# Patient Record
Sex: Female | Born: 1985 | Race: White | Hispanic: No | Smoking: Former smoker
Health system: Southern US, Community
[De-identification: ages and names within clinical notes are randomized; demographics above are authoritative.]

## PROBLEM LIST (undated history)

## (undated) ENCOUNTER — Inpatient Hospital Stay (HOSPITAL_COMMUNITY): Payer: Self-pay

## (undated) DIAGNOSIS — F419 Anxiety disorder, unspecified: Secondary | ICD-10-CM

## (undated) DIAGNOSIS — R87629 Unspecified abnormal cytological findings in specimens from vagina: Secondary | ICD-10-CM

## (undated) DIAGNOSIS — F32A Depression, unspecified: Secondary | ICD-10-CM

## (undated) DIAGNOSIS — F329 Major depressive disorder, single episode, unspecified: Secondary | ICD-10-CM

## (undated) HISTORY — PX: WISDOM TOOTH EXTRACTION: SHX21

## (undated) HISTORY — DX: Unspecified abnormal cytological findings in specimens from vagina: R87.629

## (undated) HISTORY — DX: Anxiety disorder, unspecified: F41.9

## (undated) HISTORY — DX: Depression, unspecified: F32.A

## (undated) HISTORY — PX: COLPOSCOPY: SHX161

## (undated) HISTORY — PX: APPENDECTOMY: SHX54

---
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
## 2015-11-05 ENCOUNTER — Ambulatory Visit (INDEPENDENT_AMBULATORY_CARE_PROVIDER_SITE_OTHER): Payer: Medicaid Other | Admitting: Certified Nurse Midwife

## 2015-11-05 ENCOUNTER — Encounter: Payer: Self-pay | Admitting: Certified Nurse Midwife

## 2015-11-05 VITALS — BP 124/69 | HR 87 | Temp 98.9°F | Wt 237.6 lb

## 2015-11-05 DIAGNOSIS — Z3482 Encounter for supervision of other normal pregnancy, second trimester: Secondary | ICD-10-CM

## 2015-11-05 DIAGNOSIS — Z3480 Encounter for supervision of other normal pregnancy, unspecified trimester: Secondary | ICD-10-CM | POA: Insufficient documentation

## 2015-11-05 NOTE — Progress Notes (Signed)
Subjective:    Michele Wells is being seen today for her first obstetrical visit.  This is not a planned pregnancy. She is at 3727w6d gestation. Her obstetrical history is significant for anxiety, was on mediciations Lexapro and xanax 0.25 mg for panic attacks. Relationship with FOB: significant other, living together. Patient does intend to breast feed. Pregnancy history fully reviewed.  Not currently employed.    The information documented in the HPI was reviewed and verified.  Menstrual History: OB History    Gravida Para Term Preterm AB Living   2 0 0 0 1 0   SAB TAB Ectopic Multiple Live Births   1 0 0 0 0      Menarche age: around 6312-30 years of age.   Patient's last menstrual period was 07/03/2015.    Past Medical History:  Diagnosis Date  . Anxiety     Past Surgical History:  Procedure Laterality Date  . APPENDECTOMY       (Not in a hospital admission) Not on File  Social History  Substance Use Topics  . Smoking status: Former Games developermoker  . Smokeless tobacco: Never Used  . Alcohol use No    Family History  Problem Relation Age of Onset  . Cancer Maternal Grandmother   . Diabetes Maternal Grandmother   . Cancer Maternal Grandfather   . Heart disease Paternal Grandfather      Review of Systems Constitutional: negative for weight loss Gastrointestinal: negative for vomiting Genitourinary:negative for genital lesions and vaginal discharge and dysuria Musculoskeletal:negative for back pain Behavioral/Psych: negative for abusive relationship, depression, illegal drug usage and tobacco use    Objective:    BP 124/69   Pulse 87   Temp 98.9 F (37.2 C)   Wt 237 lb 9.6 oz (107.8 kg)   LMP 07/03/2015  General Appearance:    Alert, cooperative, no distress, appears stated age  Head:    Normocephalic, without obvious abnormality, atraumatic  Eyes:    PERRL, conjunctiva/corneas clear, EOM's intact, fundi    benign, both eyes  Ears:    Normal TM's and external ear  canals, both ears  Nose:   Nares normal, septum midline, mucosa normal, no drainage    or sinus tenderness  Throat:   Lips, mucosa, and tongue normal; teeth and gums normal  Neck:   Supple, symmetrical, trachea midline, no adenopathy;    thyroid:  no enlargement/tenderness/nodules; no carotid   bruit or JVD  Back:     Symmetric, no curvature, ROM normal, no CVA tenderness  Lungs:     Clear to auscultation bilaterally, respirations unlabored  Chest Wall:    No tenderness or deformity   Heart:    Regular rate and rhythm, S1 and S2 normal, no murmur, rub   or gallop  Breast Exam:    No tenderness, masses, or nipple abnormality  Abdomen:     Soft, non-tender, bowel sounds active all four quadrants,    no masses, no organomegaly  Genitalia:    Normal female without lesion, discharge or tenderness  Extremities:   Extremities normal, atraumatic, no cyanosis or edema  Pulses:   2+ and symmetric all extremities  Skin:   Skin color, texture, turgor normal, no rashes or lesions  Lymph nodes:   Cervical, supraclavicular, and axillary nodes normal  Neurologic:   CNII-XII intact, normal strength, sensation and reflexes    throughout     FHR:   145        FH: @U .  Lab Review Urine pregnancy test Labs reviewed yes Radiologic studies reviewed no Assessment:    Pregnancy at [redacted]w[redacted]d weeks   [facility] transfer @17  wks  Plan:     Prenatal vitamins.  Counseling provided regarding continued use of seat belts, cessation of alcohol consumption, smoking or use of illicit drugs; infection precautions i.e., influenza/TDAP immunizations, toxoplasmosis,CMV, parvovirus, listeria and varicella; workplace safety, exercise during pregnancy; routine dental care, safe medications, sexual activity, hot tubs, saunas, pools, travel, caffeine use, fish and methlymercury, potential toxins, hair treatments, varicose veins Weight gain recommendations per IOM guidelines reviewed: underweight/BMI< 18.5--> gain 28 -  40 lbs; normal weight/BMI 18.5 - 24.9--> gain 25 - 35 lbs; overweight/BMI 25 - 29.9--> gain 15 - 25 lbs; obese/BMI >30->gain  11 - 20 lbs Problem list reviewed and updated. FIRST/CF mutation testing/NIPT/QUAD SCREEN/fragile X/Ashkenazi Jewish population testing/Spinal muscular atrophy discussed: ordered. Role of ultrasound in pregnancy discussed; fetal survey: ordered. Amniocentesis discussed: not indicated. VBAC calculator score: VBAC consent form provided Meds ordered this encounter  Medications  . pyridOXINE (VITAMIN B-6) 100 MG tablet    Sig: Take 100 mg by mouth daily.  Marland Kitchen doxylamine, Sleep, (UNISOM) 25 MG tablet    Sig: Take 25 mg by mouth at bedtime as needed.  . Prenatal Vit-Fe Phos-FA-Omega (VITAFOL GUMMIES PO)    Sig: Take by mouth.   Orders Placed This Encounter  Procedures  . Culture, OB Urine  . [place] MFM OB COMP + 14 WK    Standing Status:   Future    Standing Expiration Date:   01/04/2017    Order Specific Question:   Reason for Exam (SYMPTOM  OR DIAGNOSIS REQUIRED)    Answer:   fetal anatomy scan    Order Specific Question:   Preferred imaging location?    Answer:   [facility]Ul[facility]ound  . TSH  . HIV antibody  . Hemoglobinopathy evaluation  . Varicella zoster antibody, IgG  . Prenatal Profile I  . MaterniT21 PLUS Core+SCA    Order Specific Question:   Is the patient insulin dependent?    Answer:   No    Order Specific Question:   Please enter gestational age. This should be expressed as weeks AND days, i.e. 16w 6d. Enter weeks here. Enter days in next question.    Answer:   45    Order Specific Question:   Please enter gestational age. This should be expressed as weeks AND days, i.e. 16w 6d. Enter days here. Enter weeks in previous question.    Answer:   6    Order Specific Question:   How was gestational age calculated?    Answer:   Ultrasound    Order Specific Question:   Please give the date of LMP OR Ultrasound OR Estimated date of delivery.    Answer:   04/08/2016     Order Specific Question:   Number of Fetuses (Type of Pregnancy):    Answer:   1    Order Specific Question:   Indications for performing the test? (please choose all that apply):    Answer:   Routine screening    Order Specific Question:   Other Indications? (Y=Yes, N=No)    Answer:   N    Order Specific Question:   If this is a repeat specimen, please indicate the reason:    Answer:   Not indicated    Order Specific Question:   Please specify the patient's race: (C=White/Caucasion, B=Black, I=Native American, A=Asian, H=Hispanic, O=Other, U=Unknown)  Answer:   C    Order Specific Question:   Donor Egg - indicate if the egg was obtained from in vitro fertilization.    Answer:   N    Order Specific Question:   Age of Egg Donor.    Answer:   35    Order Specific Question:   Prior Down Syndrome/ONTD screening during current pregnancy.    Answer:   N    Order Specific Question:   Prior First Trimester Testing    Answer:   N    Order Specific Question:   Prior Second Trimester Testing    Answer:   N    Order Specific Question:   Family History of Neural Tube Defects    Answer:   N    Order Specific Question:   Prior Pregnancy with Down Syndrome    Answer:   N    Order Specific Question:   Please give the patient's weight (in pounds)    Answer:   237.6  . Hemoglobin A1c    Follow up in 4 weeks. 50% of 30 min visit spent on counseling and coordination of care.

## 2015-11-06 ENCOUNTER — Other Ambulatory Visit: Payer: Self-pay | Admitting: Certified Nurse Midwife

## 2015-11-07 LAB — CULTURE, OB URINE

## 2015-11-07 LAB — URINE CULTURE, OB REFLEX

## 2015-11-12 ENCOUNTER — Other Ambulatory Visit: Payer: Self-pay | Admitting: Certified Nurse Midwife

## 2015-11-12 LAB — PRENATAL PROFILE I(LABCORP)
Antibody Screen: NEGATIVE
BASOS ABS: 0 10*3/uL (ref 0.0–0.2)
Basos: 0 %
EOS (ABSOLUTE): 0.1 10*3/uL (ref 0.0–0.4)
EOS: 1 %
HEMOGLOBIN: 12 g/dL (ref 11.1–15.9)
HEP B S AG: NEGATIVE
Hematocrit: 35.8 % (ref 34.0–46.6)
IMMATURE GRANULOCYTES: 0 %
Immature Grans (Abs): 0 10*3/uL (ref 0.0–0.1)
LYMPHS ABS: 1.9 10*3/uL (ref 0.7–3.1)
Lymphs: 23 %
MCH: 29.1 pg (ref 26.6–33.0)
MCHC: 33.5 g/dL (ref 31.5–35.7)
MCV: 87 fL (ref 79–97)
MONOS ABS: 0.6 10*3/uL (ref 0.1–0.9)
Monocytes: 7 %
NEUTROS ABS: 5.8 10*3/uL (ref 1.4–7.0)
NEUTROS PCT: 69 %
PLATELETS: 275 10*3/uL (ref 150–379)
RBC: 4.12 x10E6/uL (ref 3.77–5.28)
RDW: 14.4 % (ref 12.3–15.4)
RH TYPE: POSITIVE
RPR Ser Ql: NONREACTIVE
Rubella Antibodies, IGG: 11.4 index (ref >0.99)
WBC: 8.4 10*3/uL (ref 3.4–10.8)

## 2015-11-12 LAB — HEMOGLOBINOPATHY EVALUATION
HEMOGLOBIN A2 QUANTITATION: 2.5 % (ref 0.7–3.1)
HGB A: 97.5 % (ref 94.0–98.0)
HGB C: 0 %
HGB S: 0 %
Hemoglobin F Quantitation: 0 % (ref 0.0–2.0)

## 2015-11-12 LAB — MATERNIT21 PLUS CORE+SCA
CHROMOSOME 18: NEGATIVE
Chromosome 13: NEGATIVE
Chromosome 21: NEGATIVE
PDF: 0
Y Chromosome: DETECTED

## 2015-11-12 LAB — HEMOGLOBIN A1C
ESTIMATED AVERAGE GLUCOSE: 103 mg/dL
Hgb A1c MFr Bld: 5.2 % (ref 4.8–5.6)

## 2015-11-12 LAB — TSH: TSH: 1.55 u[IU]/mL (ref 0.450–4.500)

## 2015-11-12 LAB — HIV ANTIBODY (ROUTINE TESTING W REFLEX): HIV Screen 4th Generation wRfx: NONREACTIVE

## 2015-11-12 LAB — VARICELLA ZOSTER ANTIBODY, IGG: Varicella zoster IgG: <135 index — ABNORMAL LOW (ref >165)

## 2015-11-19 ENCOUNTER — Ambulatory Visit (HOSPITAL_COMMUNITY)
Admission: RE | Admit: 2015-11-19 | Discharge: 2015-11-19 | Disposition: A | Discharge status: Home / Self Care | Payer: Medicaid Other | Source: Ambulatory Visit | Attending: Certified Nurse Midwife | Admitting: Certified Nurse Midwife

## 2015-11-19 ENCOUNTER — Other Ambulatory Visit: Payer: Self-pay | Admitting: Certified Nurse Midwife

## 2015-11-19 DIAGNOSIS — Z36 Encounter for antenatal screening of mother: Secondary | ICD-10-CM | POA: Diagnosis not present

## 2015-11-19 DIAGNOSIS — O99212 Obesity complicating pregnancy, second trimester: Secondary | ICD-10-CM | POA: Diagnosis not present

## 2015-11-19 DIAGNOSIS — E669 Obesity, unspecified: Secondary | ICD-10-CM | POA: Insufficient documentation

## 2015-11-19 DIAGNOSIS — Z3A19 19 weeks gestation of pregnancy: Secondary | ICD-10-CM | POA: Diagnosis not present

## 2015-11-19 DIAGNOSIS — Z1389 Encounter for screening for other disorder: Secondary | ICD-10-CM

## 2015-11-19 DIAGNOSIS — Z3482 Encounter for supervision of other normal pregnancy, second trimester: Secondary | ICD-10-CM

## 2015-11-23 ENCOUNTER — Other Ambulatory Visit: Payer: Self-pay | Admitting: Certified Nurse Midwife

## 2015-11-23 DIAGNOSIS — Z3482 Encounter for supervision of other normal pregnancy, second trimester: Secondary | ICD-10-CM

## 2015-12-03 ENCOUNTER — Ambulatory Visit (INDEPENDENT_AMBULATORY_CARE_PROVIDER_SITE_OTHER): Payer: Medicaid Other | Admitting: Certified Nurse Midwife

## 2015-12-03 DIAGNOSIS — Z3482 Encounter for supervision of other normal pregnancy, second trimester: Secondary | ICD-10-CM

## 2015-12-03 NOTE — Patient Instructions (Addendum)
Pregnancy, The Father's Role A father has an important role during his partner's pregnancy, labor, delivery, and after the birth of the baby. It is important to help and support your partner through this new period. There are many physical and emotional changes that happen. To be helpful and supportive during this time, you should know and understand what is happening to your partner during pregnancy, labor, delivery, and after the baby is born. WHAT ARE THE STAGES OF PREGNANCY? Pregnancy usually lasts about 40 weeks. The pregnancy is divided into three trimesters. First Trimester During the first 13 weeks, your partner may:  Feel tired.  Have painful breasts.  Feel nauseous or throw up.  Urinate more often.  Have mood changes. All of these changes are normal. If they are happening, try to be helpful, supportive, and understanding. This may include helping with household duties and activities and spending more time with each other. Second Trimester During the next 14-28 weeks:  Your partner will likely feel better and more energetic.  This is the best time of the pregnancy to be more active together.  You will be able to see her belly showing the pregnancy.  You may be able to feel the baby kick.  Your partner may have soreness or aching in her back as she gains weight. You can help her by carrying heavy things and by rubbing her back when she is feeling sore. Third Trimester During the final 12 weeks, your partner may:  Become more uncomfortable as the baby grows.  Have a hard time doing everyday activities, and her balance may be off.  Have a hard time bending over.  Tire easily.  Have difficulty sleeping. At this time, the birth of your baby is close. You and your partner may have concerns or questions. This is normal. Talk with each other and with your health care provider. Continue to help your partner with housework, encourage her to rest, and rub her sore back and  legs, if this helps her. WHAT CAN I EXPECT OR DO DURING THE PREGNANCY? You can expect to experience some changes. There are also many things you can do to help prepare you and your partner for your baby. Emotional Changes During your partner's pregnancy, emotional changes for you may include:  Having feelings of happiness, excitement, and pride.  Being concerned about having new responsibilities, such as financial or educational responsibilities.  Feeling overwhelmed or scared.  Being worried that a baby will change your relationship with your partner. These feelings are normal. Talk about them openly with your partner and your health care provider. Prenatal Care Attend prenatal care visits with your partner. This is a good time for you to get to know your health care provider, follow the pregnancy, and ask questions.  Prenatal visits usually occur one time each month for six months, then every two weeks for two months, and then one time each week during the last month. You may have more prenatal visits if your health care provider believes this is needed.  Your health care provider usually does an ultrasound of the baby at one of the prenatal visits. This may happen more often if your health care provider thinks it is needed. Sexual Activity Sexual intercourse is safe unless there is a problem with the pregnancy and your health care provider advises you to not have sexual intercourse. Because physical and emotional changes happen in pregnancy, your partner may not want to have sex during certain times. Trying different positions may  make sexual intercourse more comfortable. However, always respect your partner's decision if she does not want to have sex. It is important for both of you to discuss your feelings and desires. Talk with your health care provider about any questions that you may have about sexual intercourse during pregnancy. Childbirth Classes Attend childbirth classes with your  partner if you are able. Classes prepare you and help you to understand what happens during labor and delivery, and they help you and your partner to bond. There are even some classes that are only for new fathers. Classes also teach you and your partner:  Various relaxation techniques.  How to work with her labor pains.  How to focus during labor and delivery. WHAT SHOULD I KNOW ABOUT LABOR AND DELIVERY? Many fathers want to be present while their partner is going through labor and delivery. You may:  Be asked to time the contractions, massage your partner's back, and breathe with her during the contractions.  Get to see and enjoy the excitement of your baby being born, and you may even be able to cut your baby's umbilical cord. If you feel like you might faint or you are uncomfortable, ask someone to help you.  Need to leave the room if a problem develops during labor or delivery. A cesarean delivery, or C-section, is a procedure that may be used to deliver the baby. It is done through an incision in the abdomen and the uterus. A cesarean delivery may be scheduled or it may be an emergency procedure during labor and delivery. Most hospitals allow the father to be in the room for a cesarean delivery unless it is an emergency. Recovery from a cesarean delivery usually requires more help from the father. WHAT HAPPENS AFTER DELIVERY? After your baby is born, your partner will go through many changes again. These changes could last a few months or longer. Postpartum Depression Your partner may take awhile to regain her strength. She may also have feelings of sadness (postpartum blues or postpartum depression). If your partner is acting unusually sad or depressed, talk with your health care provider right away. This can be a serious medical condition that requires treatment. Breastfeeding Your partner may decide to breastfeed the baby. This helps with bonding between the mother and the baby, and  breast milk is the best nutrition for your baby. You can feel included by burping the baby and bottle-feeding the baby with breast milk that was collected from the mother. This allows your partner to rest and helps you to bond with your baby. Sexual Activity It may take a few months for your partner's body to heal and be ready for sexual intercourse again. This may take longer after a cesarean delivery. If you have any questions about having sexual intercourse or if it is painful for your partner, talk with your health care provider. It is possible for breastfeeding mothers to become pregnant even if they are not having menstrual periods. Use birth control (contraception) unless you and your partner would like to become pregnant again. WHAT SHOULD I REMEMBER? Fatherhood and having a baby is an ongoing learning experience. It is common to be anxious, concerned, or afraid that you may not be taking care of your newborn baby properly. It is important to talk with your partner and your health care provider if you are worried or have any questions.   This information is not intended to replace advice given to you by your health care provider. Make sure you  discuss any questions you have with your health care provider.   Document Released: 08/04/2007 Document Revised: 03/08/2014 Document Reviewed: 11/02/2013 Elsevier Interactive Patient Education 2016 [employer].  Preterm Labor Information Preterm labor is when labor starts before you are [redacted] weeks pregnant. The normal length of pregnancy is 39 to 41 weeks.  CAUSES  The cause of preterm labor is not often known. The most common known cause is infection. RISK FACTORS  Having a history of preterm labor.  Having your water break before it should.  Having a placenta that covers the opening of the cervix.  Having a placenta that breaks away from the uterus.  Having a cervix that is too weak to hold the baby in the uterus.  Having too much fluid in  the amniotic sac.  Taking drugs or smoking while pregnant.  Not gaining enough weight while pregnant.  Being younger than 44 and older than 29 years old.  Having a low income.  Being African American. SYMPTOMS  Period-like cramps, belly (abdominal) pain, or back pain.  Contractions that are regular, as often as six in an hour. They may be mild or painful.  Contractions that start at the top of the belly. They then move to the lower belly and back.  Lower belly pressure that seems to get stronger.  Bleeding from the vagina.  Fluid leaking from the vagina. TREATMENT  Treatment depends on:  Your condition.  The condition of your baby.  How many weeks pregnant you are. Your doctor may have you:  Take medicine to stop contractions.  Stay in bed except to use the restroom (bed rest).  Stay in the hospital. WHAT SHOULD YOU DO IF YOU THINK YOU ARE IN PRETERM LABOR? Call your doctor right away. You need to go to the hospital right away.  HOW CAN YOU PREVENT PRETERM LABOR IN FUTURE PREGNANCIES?  Stop smoking, if you smoke.  Maintain healthy weight gain.  Do not take drugs or be around chemicals that are not needed.  Tell your doctor if you think you have an infection.  Tell your doctor if you had a preterm labor before.   This information is not intended to replace advice given to you by your health care provider. Make sure you discuss any questions you have with your health care provider.   Document Released: 05/14/2008 Document Revised: 07/02/2014 Document Reviewed: 03/20/2012 Elsevier Interactive Patient Education 2016 [employer]. Second Trimester of Pregnancy The second trimester is from week 13 through week 28, months 4 through 6. The second trimester is often a time when you feel your best. Your body has also adjusted to being pregnant, and you begin to feel better physically. Usually, morning sickness has lessened or quit completely, you may have more energy,  and you may have an increase in appetite. The second trimester is also a time when the fetus is growing rapidly. At the end of the sixth month, the fetus is about 9 inches long and weighs about 1 pounds. You will likely begin to feel the baby move (quickening) between 18 and 20 weeks of the pregnancy. BODY CHANGES Your body goes through many changes during pregnancy. The changes vary from woman to woman.   Your weight will continue to increase. You will notice your lower abdomen bulging out.  You may begin to get stretch marks on your hips, abdomen, and breasts.  You may develop headaches that can be relieved by medicines approved by your health care provider.  You may  urinate more often because the fetus is pressing on your bladder.  You may develop or continue to have heartburn as a result of your pregnancy.  You may develop constipation because certain hormones are causing the muscles that push waste through your intestines to slow down.  You may develop hemorrhoids or swollen, bulging veins (varicose veins).  You may have back pain because of the weight gain and pregnancy hormones relaxing your joints between the bones in your pelvis and as a result of a shift in weight and the muscles that support your balance.  Your breasts will continue to grow and be tender.  Your gums may bleed and may be sensitive to brushing and flossing.  Dark spots or blotches (chloasma, mask of pregnancy) may develop on your face. This will likely fade after the baby is born.  A dark line from your belly button to the pubic area (linea nigra) may appear. This will likely fade after the baby is born.  You may have changes in your hair. These can include thickening of your hair, rapid growth, and changes in texture. Some women also have hair loss during or after pregnancy, or hair that feels dry or thin. Your hair will most likely return to normal after your baby is born. WHAT TO EXPECT AT YOUR PRENATAL  VISITS During a routine prenatal visit:  You will be weighed to make sure you and the fetus are growing normally.  Your blood pressure will be taken.  Your abdomen will be measured to track your baby's growth.  The fetal heartbeat will be listened to.  Any test results from the previous visit will be discussed. Your health care provider may ask you:  How you are feeling.  If you are feeling the baby move.  If you have had any abnormal symptoms, such as leaking fluid, bleeding, severe headaches, or abdominal cramping.  If you are using any tobacco products, including cigarettes, chewing tobacco, and electronic cigarettes.  If you have any questions. Other tests that may be performed during your second trimester include:  Blood tests that check for:  Low iron levels (anemia).  Gestational diabetes (between 24 and 28 weeks).  Rh antibodies.  Urine tests to check for infections, diabetes, or protein in the urine.  An ultrasound to confirm the proper growth and development of the baby.  An amniocentesis to check for possible genetic problems.  Fetal screens for spina bifida and Down syndrome.  HIV (human immunodeficiency virus) testing. Routine prenatal testing includes screening for HIV, unless you choose not to have this test. HOME CARE INSTRUCTIONS   Avoid all smoking, herbs, alcohol, and unprescribed drugs. These chemicals affect the formation and growth of the baby.  Do not use any tobacco products, including cigarettes, chewing tobacco, and electronic cigarettes. If you need help quitting, ask your health care provider. You may receive counseling support and other resources to help you quit.  Follow your health care provider's instructions regarding medicine use. There are medicines that are either safe or unsafe to take during pregnancy.  Exercise only as directed by your health care provider. Experiencing uterine cramps is a good sign to stop  exercising.  Continue to eat regular, healthy meals.  Wear a good support bra for breast tenderness.  Do not use hot tubs, steam rooms, or saunas.  Wear your seat belt at all times when driving.  Avoid raw meat, uncooked cheese, cat litter boxes, and soil used by cats. These carry germs that can cause  birth defects in the baby.  Take your prenatal vitamins.  Take 1500-2000 mg of calcium daily starting at the 20th week of pregnancy until you deliver your baby.  Try taking a stool softener (if your health care provider approves) if you develop constipation. Eat more high-fiber foods, such as fresh vegetables or fruit and whole grains. Drink plenty of fluids to keep your urine clear or pale yellow.  Take warm sitz baths to soothe any pain or discomfort caused by hemorrhoids. Use hemorrhoid cream if your health care provider approves.  If you develop varicose veins, wear support hose. Elevate your feet for 15 minutes, 3-4 times a day. Limit salt in your diet.  Avoid heavy lifting, wear low heel shoes, and practice good posture.  Rest with your legs elevated if you have leg cramps or low back pain.  Visit your dentist if you have not gone yet during your pregnancy. Use a soft toothbrush to brush your teeth and be gentle when you floss.  A sexual relationship may be continued unless your health care provider directs you otherwise.  Continue to go to all your prenatal visits as directed by your health care provider. SEEK MEDICAL CARE IF:   You have dizziness.  You have mild pelvic cramps, pelvic pressure, or nagging pain in the abdominal area.  You have persistent nausea, vomiting, or diarrhea.  You have a bad smelling vaginal discharge.  You have pain with urination. SEEK IMMEDIATE MEDICAL CARE IF:   You have a fever.  You are leaking fluid from your vagina.  You have spotting or bleeding from your vagina.  You have severe abdominal cramping or pain.  You have rapid  weight gain or loss.  You have shortness of breath with chest pain.  You notice sudden or extreme swelling of your face, hands, ankles, feet, or legs.  You have not felt your baby move in over an hour.  You have severe headaches that do not go away with medicine.  You have vision changes.   This information is not intended to replace advice given to you by your health care provider. Make sure you discuss any questions you have with your health care provider.   Document Released: 02/09/2001 Document Revised: 03/08/2014 Document Reviewed: 04/18/2012 Elsevier Interactive Patient Education [employer]

## 2015-12-03 NOTE — Progress Notes (Signed)
Subjective:    Michele Wells is a 30 y.o. female being seen today for her obstetrical visit. She is at 6447w6d gestation. Patient reports: no complaints . Fetal movement: normal.  Problem List Items Addressed This Visit      Other   Encounter for supervision of other normal pregnancy in second trimester    Other Visit Diagnoses   None.    Patient Active Problem List   Diagnosis Date Noted  . Encounter for supervision of other normal pregnancy in second trimester 11/05/2015   Objective:    BP 137/77   Pulse 81   Temp 98.7 F (37.1 C)   Wt 238 lb 12.8 oz (108.3 kg)   LMP 07/03/2015  FHT: 140 BPM  Uterine Size: 23 cm and size equals dates     Assessment:    Pregnancy @ 6347w6d    Doing well  Plan:    OBGCT: discussed. Signs and symptoms of preterm labor: discussed and handout given.  Labs, problem list reviewed and updated 2 hr GTT planned Follow up in 4 weeks.

## 2015-12-03 NOTE — Progress Notes (Signed)
Patient declined flu vaccine at this time.

## 2016-01-05 ENCOUNTER — Ambulatory Visit (INDEPENDENT_AMBULATORY_CARE_PROVIDER_SITE_OTHER): Payer: Medicaid Other | Admitting: Certified Nurse Midwife

## 2016-01-05 VITALS — BP 117/75 | HR 93 | Wt 239.5 lb

## 2016-01-05 DIAGNOSIS — Z3482 Encounter for supervision of other normal pregnancy, second trimester: Secondary | ICD-10-CM

## 2016-01-05 NOTE — Progress Notes (Signed)
Subjective:    Michele MakiJessica L Wells is a 30 y.o. female being seen today for her obstetrical visit. She is at 2640w4d gestation. Patient reports: no complaints . Fetal movement: normal.  Problem List Items Addressed This Visit      Other   Encounter for supervision of other normal pregnancy in second trimester - Primary   Relevant Orders   US MFM OB FOLLOW UP     Patient Active Problem List   Diagnosis Date Noted  . 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    OBGCT: discussed and ordered for next visit. Signs and symptoms of preterm labor: discussed.  Labs, problem list reviewed and updated 2 hr GTT planned Follow up in 2 weeks with 2 hour OGTT.

## 2016-01-14 ENCOUNTER — Ambulatory Visit (HOSPITAL_COMMUNITY)
Admission: RE | Admit: 2016-01-14 | Discharge: 2016-01-14 | Disposition: A | Discharge status: Home / Self Care | Payer: Medicaid Other | Source: Ambulatory Visit | Attending: Certified Nurse Midwife | Admitting: Certified Nurse Midwife

## 2016-01-14 ENCOUNTER — Other Ambulatory Visit: Payer: Self-pay | Admitting: Certified Nurse Midwife

## 2016-01-14 DIAGNOSIS — O99212 Obesity complicating pregnancy, second trimester: Secondary | ICD-10-CM | POA: Insufficient documentation

## 2016-01-14 DIAGNOSIS — O358XX0 Maternal care for other (suspected) fetal abnormality and damage, not applicable or unspecified: Secondary | ICD-10-CM | POA: Insufficient documentation

## 2016-01-14 DIAGNOSIS — O99213 Obesity complicating pregnancy, third trimester: Secondary | ICD-10-CM

## 2016-01-14 DIAGNOSIS — O26843 Uterine size-date discrepancy, third trimester: Secondary | ICD-10-CM

## 2016-01-14 DIAGNOSIS — O26842 Uterine size-date discrepancy, second trimester: Secondary | ICD-10-CM | POA: Insufficient documentation

## 2016-01-14 DIAGNOSIS — Z3482 Encounter for supervision of other normal pregnancy, second trimester: Secondary | ICD-10-CM

## 2016-01-14 DIAGNOSIS — O359XX0 Maternal care for (suspected) fetal abnormality and damage, unspecified, not applicable or unspecified: Secondary | ICD-10-CM

## 2016-01-14 DIAGNOSIS — Z3A27 27 weeks gestation of pregnancy: Secondary | ICD-10-CM | POA: Diagnosis not present

## 2016-01-19 ENCOUNTER — Ambulatory Visit (INDEPENDENT_AMBULATORY_CARE_PROVIDER_SITE_OTHER): Payer: Medicaid Other | Admitting: Obstetrics and Gynecology

## 2016-01-19 ENCOUNTER — Other Ambulatory Visit: Payer: Medicaid Other

## 2016-01-19 DIAGNOSIS — Z3482 Encounter for supervision of other normal pregnancy, second trimester: Secondary | ICD-10-CM

## 2016-01-19 DIAGNOSIS — Z283 Underimmunization status: Secondary | ICD-10-CM

## 2016-01-19 DIAGNOSIS — Z3483 Encounter for supervision of other normal pregnancy, third trimester: Secondary | ICD-10-CM

## 2016-01-19 DIAGNOSIS — O09893 Supervision of other high risk pregnancies, third trimester: Secondary | ICD-10-CM | POA: Insufficient documentation

## 2016-01-19 MED ORDER — TETANUS-DIPHTH-ACELL PERTUSSIS 5-2.5-18.5 LF-MCG/0.5 IM SUSP
0.5000 mL | Freq: Once | INTRAMUSCULAR | Status: AC
  Administered 2016-01-19: 0.5 mL via INTRAMUSCULAR

## 2016-01-19 NOTE — Progress Notes (Signed)
   PRENATAL VISIT NOTE  Subjective:  Michele Wells is a 30 y.o. G2P0010 at 2457w4d being seen today for ongoing prenatal care.  She is currently monitored for the following issues for this low-risk pregnancy and has Encounter for supervision of other normal pregnancy in second trimester and Susceptible to Varicella (non-immune), currently pregnant in third trimester on her problem list.  Patient reports no complaints.  Contractions: Not present. Vag. Bleeding: None.  Movement: Present. Denies leaking of fluid.   The following portions of the patient's history were reviewed and updated as appropriate: allergies, current medications, past family history, past medical history, past social history, past surgical history and problem list. Problem list updated.  Objective:   Vitals:   01/19/16 0827  BP: 103/71  Pulse: 94  Temp: 97.6 F (36.4 C)  Weight: 243 lb 9.6 oz (110.5 kg)    Fetal Status: Fetal Heart Rate (bpm): 152 Fundal Height: 30 cm Movement: Present     General:  Alert, oriented and cooperative. Patient is in no acute distress.  Skin: Skin is warm and dry. No rash noted.   Cardiovascular: Normal heart rate noted  Respiratory: Normal respiratory effort, no problems with respiration noted  Abdomen: Soft, gravid, appropriate for gestational age. Pain/Pressure: Present     Pelvic:  Cervical exam deferred        Extremities: Normal range of motion.  Edema: None  Mental Status: Normal mood and affect. Normal behavior. Normal judgment and thought content.   Assessment and Plan:  Pregnancy: G2P0010 at 6357w4d  1. Encounter for supervision of other normal pregnancy in second trimester Patient is doing well without complaints She is interested in water birth. Information provided and patient was advised to sign up for class 2 hr glucola, Tdap and labs today Patient declined flu vaccine - Glucose Tolerance, 2 Hours w/1 Hour - CBC - HIV antibody - RPR  2. Susceptible to  Varicella (non-immune), currently pregnant in third trimester Will offer pp  Preterm labor symptoms and general obstetric precautions including but not limited to vaginal bleeding, contractions, leaking of fluid and fetal movement were reviewed in detail with the patient. Please refer to After Visit Summary for other counseling recommendations.  Return in about 2 weeks (around 02/02/2016).   Catalina AntiguaPeggy Dawid Dupriest, MD

## 2016-01-19 NOTE — Patient Instructions (Signed)
Thinking About Waterbirth???  You must attend a Waterbirth class at [facility]  3rd Wednesday of every month from 7-9pm  Free  Register by calling [phone] or online at [email or web address]  Bring us the certificate from the class  Waterbirth supplies needed for [facility] Department patients:  Our practice has a Birth Pool in a Box tub at the hospital that you can borrow  You will need to purchase an accessory kit that has all needed supplies through [facility] ([phone]) or online $175.00  Or you can purchase the supplies separately: o Single-use disposable tub liner for Birth Pool in a Box (REGULAR size) o New garden hose labeled "lead-free", "suitable for drinking water", o Electric drain pump to remove water (We recommend 792 gallon per hour or greater pump.)  o  "non-toxic" OR "water potable" o Garden hose to remove the dirty water o Fish net o Bathing suit top (optional) o Long-handled mirror (optional)  [employer] sells tubs for ~ $120 if you would rather purchase your own tub.  They also sell accessories, liners.    Www.waterbirthsolutions.com for tub purchases and supplies  [employer] (www.thelaborladies.com) $275 for tub rental/set-up & take down/kit   [employer] [employer] information regarding doulas (labor support) who provide pool rentals:  Http://www.[facility]anc.org/MeetUs.htm   [employer] (www.thelaborladies.com)  Http://www.padanc.org/MeetUs.htm   Things that would prevent you from having a waterbirth:  Premature, <37wks  Previous cesarean birth  Presence of thick meconium-stained fluid  Multiple gestation (Twins, triplets, etc.)  Uncontrolled diabetes or gestational diabetes requiring medication  Hypertension  Heavy vaginal bleeding  Non-reassuring fetal heart rate  Active infection (MRSA, etc.)  If your labor has to be induced and induction method requires continuous  monitoring of the baby's heart rate  Other risks/issues identified by your obstetrical provider   

## 2016-01-19 NOTE — Progress Notes (Signed)
Patient states that she feels good today, reports good fetal movement. 

## 2016-01-20 LAB — GLUCOSE TOLERANCE, 2 HOURS W/ 1HR
GLUCOSE, 1 HOUR: 92 mg/dL (ref 65–179)
GLUCOSE, 2 HOUR: 101 mg/dL (ref 65–152)
GLUCOSE, FASTING: 78 mg/dL (ref 65–91)

## 2016-01-20 LAB — CBC
HEMATOCRIT: 35 % (ref 34.0–46.6)
Hemoglobin: 11.8 g/dL (ref 11.1–15.9)
MCH: 30 pg (ref 26.6–33.0)
MCHC: 33.7 g/dL (ref 31.5–35.7)
MCV: 89 fL (ref 79–97)
Platelets: 312 10*3/uL (ref 150–379)
RBC: 3.93 x10E6/uL (ref 3.77–5.28)
RDW: 14.6 % (ref 12.3–15.4)
WBC: 8.9 10*3/uL (ref 3.4–10.8)

## 2016-01-20 LAB — HIV ANTIBODY (ROUTINE TESTING W REFLEX): HIV SCREEN 4TH GENERATION: NONREACTIVE

## 2016-01-20 LAB — RPR: RPR: NONREACTIVE

## 2016-02-06 ENCOUNTER — Ambulatory Visit (INDEPENDENT_AMBULATORY_CARE_PROVIDER_SITE_OTHER): Payer: Medicaid Other | Admitting: Certified Nurse Midwife

## 2016-02-06 VITALS — BP 101/67 | HR 88 | Wt 244.4 lb

## 2016-02-06 DIAGNOSIS — Z283 Underimmunization status: Secondary | ICD-10-CM

## 2016-02-06 DIAGNOSIS — O09893 Supervision of other high risk pregnancies, third trimester: Secondary | ICD-10-CM

## 2016-02-06 DIAGNOSIS — Z3483 Encounter for supervision of other normal pregnancy, third trimester: Secondary | ICD-10-CM

## 2016-02-06 DIAGNOSIS — Z3480 Encounter for supervision of other normal pregnancy, unspecified trimester: Secondary | ICD-10-CM

## 2016-02-06 NOTE — Progress Notes (Signed)
Patient reports she is doing well today. 

## 2016-02-06 NOTE — Progress Notes (Signed)
Subjective:    Michele Wells is a 30 y.o. female being seen today for her obstetrical visit. She is at 7727w1d gestation. Patient reports no complaints. Fetal movement: normal.  Problem List Items Addressed This Visit      Other   Supervision of other normal pregnancy, antepartum   Susceptible to Varicella (non-immune), currently pregnant in third trimester - Primary     Patient Active Problem List   Diagnosis Date Noted  . Susceptible to Varicella (non-immune), currently pregnant in third trimester 01/19/2016  . Supervision of other normal pregnancy, antepartum 11/05/2015   Objective:    BP 101/67   Pulse 88   Wt 244 lb 6.4 oz (110.9 kg)   LMP 07/03/2015  FHT:  146 BPM  Uterine Size: 31 cm and size equals dates  Presentation: cephalic     Assessment:    Pregnancy @ 6527w1d weeks     Doing well  Plan:    Desires natural labor    labs reviewed, problem list updated Consent signed. GBS planning TDAP given previously  Rhogam given for RH negative Pediatrician: discussed. Infant feeding: plans to breastfeed. Maternity leave: discussed, is not currently employed, fired for being pregnant. Cigarette smoking: never smoked. No orders of the defined types were placed in this encounter.  No orders of the defined types were placed in this encounter.  Follow up in 2 Weeks.

## 2016-02-26 ENCOUNTER — Ambulatory Visit (INDEPENDENT_AMBULATORY_CARE_PROVIDER_SITE_OTHER): Payer: Medicaid Other | Admitting: Certified Nurse Midwife

## 2016-02-26 VITALS — BP 130/80 | HR 91 | Wt 252.0 lb

## 2016-02-26 DIAGNOSIS — O09893 Supervision of other high risk pregnancies, third trimester: Secondary | ICD-10-CM

## 2016-02-26 DIAGNOSIS — Z3480 Encounter for supervision of other normal pregnancy, unspecified trimester: Secondary | ICD-10-CM

## 2016-02-26 DIAGNOSIS — Z2839 Other underimmunization status: Secondary | ICD-10-CM

## 2016-02-26 DIAGNOSIS — Z3483 Encounter for supervision of other normal pregnancy, third trimester: Secondary | ICD-10-CM

## 2016-02-26 DIAGNOSIS — Z283 Underimmunization status: Secondary | ICD-10-CM

## 2016-02-26 NOTE — Patient Instructions (Addendum)
Third Trimester of Pregnancy The third trimester is from week 29 through week 40 (months 7 through 9). The third trimester is a time when the unborn baby (fetus) is growing rapidly. At the end of the ninth month, the fetus is about 20 inches in length and weighs 6-10 pounds. Body changes during your third trimester Your body goes through many changes during pregnancy. The changes vary from woman to woman. During the third trimester:  Your weight will continue to increase. You can expect to gain 25-35 pounds (11-16 kg) by the end of the pregnancy.  You may begin to get stretch marks on your hips, abdomen, and breasts.  You may urinate more often because the fetus is moving lower into your pelvis and pressing on your bladder.  You may develop or continue to have heartburn. This is caused by increased hormones that slow down muscles in the digestive tract.  You may develop or continue to have constipation because increased hormones slow digestion and cause the muscles that push waste through your intestines to relax.  You may develop hemorrhoids. These are swollen veins (varicose veins) in the rectum that can itch or be painful.  You may develop swollen, bulging veins (varicose veins) in your legs.  You may have increased body aches in the pelvis, back, or thighs. This is due to weight gain and increased hormones that are relaxing your joints.  You may have changes in your hair. These can include thickening of your hair, rapid growth, and changes in texture. Some women also have hair loss during or after pregnancy, or hair that feels dry or thin. Your hair will most likely return to normal after your baby is born.  Your breasts will continue to grow and they will continue to become tender. A yellow fluid (colostrum) may leak from your breasts. This is the first milk you are producing for your baby.  Your belly button may stick out.  You may notice more swelling in your hands, face, or  ankles.  You may have increased tingling or numbness in your hands, arms, and legs. The skin on your belly may also feel numb.  You may feel short of breath because of your expanding uterus.  You may have more problems sleeping. This can be caused by the size of your belly, increased need to urinate, and an increase in your body's metabolism.  You may notice the fetus "dropping," or moving lower in your abdomen.  You may have increased vaginal discharge.  Your cervix becomes thin and soft (effaced) near your due date. What to expect at prenatal visits You will have prenatal exams every 2 weeks until week 36. Then you will have weekly prenatal exams. During a routine prenatal visit:  You will be weighed to make sure you and the fetus are growing normally.  Your blood pressure will be taken.  Your abdomen will be measured to track your baby's growth.  The fetal heartbeat will be listened to.  Any test results from the previous visit will be discussed.  You may have a cervical check near your due date to see if you have effaced. At around 36 weeks, your health care provider will check your cervix. At the same time, your health care provider will also perform a test on the secretions of the vaginal tissue. This test is to determine if a type of bacteria, Group B streptococcus, is present. Your health care provider will explain this further. Your health care provider may ask you:    What your birth plan is.  How you are feeling.  If you are feeling the baby move.  If you have had any abnormal symptoms, such as leaking fluid, bleeding, severe headaches, or abdominal cramping.  If you are using any tobacco products, including cigarettes, chewing tobacco, and electronic cigarettes.  If you have any questions. Other tests or screenings that may be performed during your third trimester include:  Blood tests that check for low iron levels (anemia).  Fetal testing to check the health,  activity level, and growth of the fetus. Testing is done if you have certain medical conditions or if there are problems during the pregnancy.  Nonstress test (NST). This test checks the health of your baby to make sure there are no signs of problems, such as the baby not getting enough oxygen. During this test, a belt is placed around your belly. The baby is made to move, and its heart rate is monitored during movement. What is false labor? False labor is a condition in which you feel small, irregular tightenings of the muscles in the womb (contractions) that eventually go away. These are called Braxton Hicks contractions. Contractions may last for hours, days, or even weeks before true labor sets in. If contractions come at regular intervals, become more frequent, increase in intensity, or become painful, you should see your health care provider. What are the signs of labor?  Abdominal cramps.  Regular contractions that start at 10 minutes apart and become stronger and more frequent with time.  Contractions that start on the top of the uterus and spread down to the lower abdomen and back.  Increased pelvic pressure and dull back pain.  A watery or bloody mucus discharge that comes from the vagina.  Leaking of amniotic fluid. This is also known as your "water breaking." It could be a slow trickle or a gush. Let your doctor know if it has a color or strange odor. If you have any of these signs, call your health care provider right away, even if it is before your due date. Follow these instructions at home: Eating and drinking  Continue to eat regular, healthy meals.  Do not eat:  Raw meat or meat spreads.  Unpasteurized milk or cheese.  Unpasteurized juice.  Store-made salad.  Refrigerated smoked seafood.  Hot dogs or deli meat, unless they are piping hot.  More than 6 ounces of albacore tuna a week.  Shark, swordfish, king mackerel, or tile fish.  Store-made salads.  Raw  sprouts, such as mung bean or alfalfa sprouts.  Take prenatal vitamins as told by your health care provider.  Take 1000 mg of calcium daily as told by your health care provider.  If you develop constipation:  Take over-the-counter or prescription medicines.  Drink enough fluid to keep your urine clear or pale yellow.  Eat foods that are high in fiber, such as fresh fruits and vegetables, whole grains, and beans.  Limit foods that are high in fat and processed sugars, such as fried and sweet foods. Activity  Exercise only as directed by your health care provider. Healthy pregnant women should aim for 2 hours and 30 minutes of moderate exercise per week. If you experience any pain or discomfort while exercising, stop.  Avoid heavy lifting.  Do not exercise in extreme heat or humidity, or at high altitudes.  Wear low-heel, comfortable shoes.  Practice good posture.  Do not travel far distances unless it is absolutely necessary and only with the approval   of your health care provider.  Wear your seat belt at all times while in a car, on a bus, or on a plane.  Take frequent breaks and rest with your legs elevated if you have leg cramps or low back pain.  Do not use hot tubs, steam rooms, or saunas.  You may continue to have sex unless your health care provider tells you otherwise. Lifestyle  Do not use any products that contain nicotine or tobacco, such as cigarettes and e-cigarettes. If you need help quitting, ask your health care provider.  Do not drink alcohol.  Do not use any medicinal herbs or unprescribed drugs. These chemicals affect the formation and growth of the baby.  If you develop varicose veins:  Wear support pantyhose or compression stockings as told by your healthcare provider.  Elevate your feet for 15 minutes, 3-4 times a day.  Wear a supportive maternity bra to help with breast tenderness. General instructions  Take over-the-counter and prescription  medicines only as told by your health care provider. There are medicines that are either safe or unsafe to take during pregnancy.  Take warm sitz baths to soothe any pain or discomfort caused by hemorrhoids. Use hemorrhoid cream or witch hazel if your health care provider approves.  Avoid cat litter boxes and soil used by cats. These carry germs that can cause birth defects in the baby. If you have a cat, ask someone to clean the litter box for you.  To prepare for the arrival of your baby:  Take prenatal classes to understand, practice, and ask questions about the labor and delivery.  Make a trial run to the hospital.  Visit the hospital and tour the maternity area.  Arrange for maternity or paternity leave through employers.  Arrange for family and friends to take care of pets while you are in the hospital.  Purchase a rear-facing car seat and make sure you know how to install it in your car.  Pack your hospital bag.  Prepare the baby's nursery. Make sure to remove all pillows and stuffed animals from the baby's crib to prevent suffocation.  Visit your dentist if you have not gone during your pregnancy. Use a soft toothbrush to brush your teeth and be gentle when you floss.  Keep all prenatal follow-up visits as told by your health care provider. This is important. Contact a health care provider if:  You are unsure if you are in labor or if your water has broken.  You become dizzy.  You have mild pelvic cramps, pelvic pressure, or nagging pain in your abdominal area.  You have lower back pain.  You have persistent nausea, vomiting, or diarrhea.  You have an unusual or bad smelling vaginal discharge.  You have pain when you urinate. Get help right away if:  You have a fever.  You are leaking fluid from your vagina.  You have spotting or bleeding from your vagina.  You have severe abdominal pain or cramping.  You have rapid weight loss or weight gain.  You have  shortness of breath with chest pain.  You notice sudden or extreme swelling of your face, hands, ankles, feet, or legs.  Your baby makes fewer than 10 movements in 2 hours.  You have severe headaches that do not go away with medicine.  You have vision changes. Summary  The third trimester is from week 29 through week 40, months 7 through 9. The third trimester is a time when the unborn baby (fetus)   is growing rapidly.  During the third trimester, your discomfort may increase as you and your baby continue to gain weight. You may have abdominal, leg, and back pain, sleeping problems, and an increased need to urinate.  During the third trimester your breasts will keep growing and they will continue to become tender. A yellow fluid (colostrum) may leak from your breasts. This is the first milk you are producing for your baby.  False labor is a condition in which you feel small, irregular tightenings of the muscles in the womb (contractions) that eventually go away. These are called Braxton Hicks contractions. Contractions may last for hours, days, or even weeks before true labor sets in.  Signs of labor can include: abdominal cramps; regular contractions that start at 10 minutes apart and become stronger and more frequent with time; watery or bloody mucus discharge that comes from the vagina; increased pelvic pressure and dull back pain; and leaking of amniotic fluid. This information is not intended to replace advice given to you by your health care provider. Make sure you discuss any questions you have with your health care provider. Document Released: 02/09/2001 Document Revised: 07/24/2015 Document Reviewed: 04/18/2012 Elsevier Interactive Patient Education  2017 [employer]  

## 2016-02-26 NOTE — Progress Notes (Signed)
Subjective:    Michele MakiJessica L Wells is a 30 y.o. female being seen today for her obstetrical visit. She is at 108w0d gestation. Patient reports no complaints. Fetal movement: normal.  Problem List Items Addressed This Visit      Other   Supervision of other normal pregnancy, antepartum - Primary   Susceptible to Varicella (non-immune), currently pregnant in third trimester     Patient Active Problem List   Diagnosis Date Noted  . Susceptible to Varicella (non-immune), currently pregnant in third trimester 01/19/2016  . Supervision of other normal pregnancy, antepartum 11/05/2015   Objective:    BP 130/80   Pulse 91   Wt 252 lb (114.3 kg)   LMP 07/03/2015  FHT:  146 BPM  Uterine Size: 34 cm and size equals dates  Presentation: cephalic     Assessment:    Pregnancy @ 128w0d weeks   doing well  Plan:     labs reviewed, problem list updated Consent signed. GBS planning TDAP given 01/19/16   Pediatrician: discussed. Infant feeding: plans to breastfeed. Maternity leave: discussed. Cigarette smoking: never smoked. No orders of the defined types were placed in this encounter.  No orders of the defined types were placed in this encounter.  Follow up in 1 Week.

## 2016-03-01 NOTE — L&D Delivery Note (Signed)
Delivery Note At 3:57 PM a viable, vigorous baby boywas delivered via Vaginal, Spontaneous Delivery vertex position OA to LOT with excellent maternal effort.  APGAR: 9, 9; weight 8 lb 4.5 oz (3756 g).  Placenta status: delivered spontaneously with maternal effort in [place].  Cord:3VX  with the following complications: none.  Anesthesia: Local 1% lidocaine injected Episiotomy: None Lacerations: L Labial, hemostatic not repaired; shallow 2nd degree repaired Suture Repair: 3.0 Est. Blood Loss (mL): 200  Mom to postpartum.  Baby to Nursery.  Baird KayKathryn Manchester 04/17/2016, 5:36 PM   I was gloved and present for entire delivery SVD without incident No difficulty with shoulders  Lacerations as listed above Repair of same supervised by me Aviva SignsMarie L Torah Pinnock, CNM

## 2016-03-04 ENCOUNTER — Other Ambulatory Visit (HOSPITAL_COMMUNITY)
Admission: RE | Admit: 2016-03-04 | Discharge: 2016-03-04 | Disposition: A | Discharge status: Home / Self Care | Payer: Medicaid Other | Source: Ambulatory Visit | Attending: Certified Nurse Midwife | Admitting: Certified Nurse Midwife

## 2016-03-04 ENCOUNTER — Ambulatory Visit (INDEPENDENT_AMBULATORY_CARE_PROVIDER_SITE_OTHER): Payer: Medicaid Other | Admitting: Certified Nurse Midwife

## 2016-03-04 VITALS — BP 132/84 | HR 107 | Temp 98.0°F | Wt 255.7 lb

## 2016-03-04 DIAGNOSIS — Z3483 Encounter for supervision of other normal pregnancy, third trimester: Secondary | ICD-10-CM | POA: Diagnosis present

## 2016-03-04 DIAGNOSIS — Z3A35 35 weeks gestation of pregnancy: Secondary | ICD-10-CM | POA: Diagnosis not present

## 2016-03-04 DIAGNOSIS — O09893 Supervision of other high risk pregnancies, third trimester: Secondary | ICD-10-CM

## 2016-03-04 DIAGNOSIS — Z283 Underimmunization status: Principal | ICD-10-CM

## 2016-03-04 DIAGNOSIS — Z3480 Encounter for supervision of other normal pregnancy, unspecified trimester: Secondary | ICD-10-CM

## 2016-03-04 NOTE — Progress Notes (Signed)
Subjective:    Michele Wells is a 31 y.o. female being seen today for her obstetrical visit. She is at 366w0d gestation. Patient reports no complaints. Fetal movement: normal.  Problem List Items Addressed This Visit      Other   Supervision of other normal pregnancy, antepartum   Relevant Orders   Strep Gp B NAA   Cervicovaginal ancillary only   Susceptible to Varicella (non-immune), currently pregnant in third trimester - Primary     Patient Active Problem List   Diagnosis Date Noted  . Susceptible to Varicella (non-immune), currently pregnant in third trimester 01/19/2016  . Supervision of other normal pregnancy, antepartum 11/05/2015   Objective:    BP 132/84   Pulse (!) 107   Temp 98 F (36.7 C)   Wt 255 lb 11.2 oz (116 kg)   LMP 07/03/2015  FHT:  146 BPM  Uterine Size: 35 cm and size equals dates  Presentation: cephalic    Cervix: long, closed, thick, -3 station, cephalic, anterior.   Assessment:    Pregnancy @ 646w0d weeks   Doing well  Plan:     labs reviewed, problem list updated  GBS sent TDAP given 01/19/16   Pediatrician: discussed. Infant feeding: plans to breastfeed. Maternity leave: discussed. Cigarette smoking: never smoked. Orders Placed This Encounter  Procedures  . Strep Gp B NAA   No orders of the defined types were placed in this encounter.  Follow up in 1 Week.

## 2016-03-04 NOTE — Patient Instructions (Addendum)
Third Trimester of Pregnancy The third trimester is from week 29 through week 40 (months 7 through 9). The third trimester is a time when the unborn baby (fetus) is growing rapidly. At the end of the ninth month, the fetus is about 20 inches in length and weighs 6-10 pounds. Body changes during your third trimester Your body goes through many changes during pregnancy. The changes vary from woman to woman. During the third trimester:  Your weight will continue to increase. You can expect to gain 25-35 pounds (11-16 kg) by the end of the pregnancy.  You may begin to get stretch marks on your hips, abdomen, and breasts.  You may urinate more often because the fetus is moving lower into your pelvis and pressing on your bladder.  You may develop or continue to have heartburn. This is caused by increased hormones that slow down muscles in the digestive tract.  You may develop or continue to have constipation because increased hormones slow digestion and cause the muscles that push waste through your intestines to relax.  You may develop hemorrhoids. These are swollen veins (varicose veins) in the rectum that can itch or be painful.  You may develop swollen, bulging veins (varicose veins) in your legs.  You may have increased body aches in the pelvis, back, or thighs. This is due to weight gain and increased hormones that are relaxing your joints.  You may have changes in your hair. These can include thickening of your hair, rapid growth, and changes in texture. Some women also have hair loss during or after pregnancy, or hair that feels dry or thin. Your hair will most likely return to normal after your baby is born.  Your breasts will continue to grow and they will continue to become tender. A yellow fluid (colostrum) may leak from your breasts. This is the first milk you are producing for your baby.  Your belly button may stick out.  You may notice more swelling in your hands, face, or  ankles.  You may have increased tingling or numbness in your hands, arms, and legs. The skin on your belly may also feel numb.  You may feel short of breath because of your expanding uterus.  You may have more problems sleeping. This can be caused by the size of your belly, increased need to urinate, and an increase in your body's metabolism.  You may notice the fetus "dropping," or moving lower in your abdomen.  You may have increased vaginal discharge.  Your cervix becomes thin and soft (effaced) near your due date. What to expect at prenatal visits You will have prenatal exams every 2 weeks until week 36. Then you will have weekly prenatal exams. During a routine prenatal visit:  You will be weighed to make sure you and the fetus are growing normally.  Your blood pressure will be taken.  Your abdomen will be measured to track your baby's growth.  The fetal heartbeat will be listened to.  Any test results from the previous visit will be discussed.  You may have a cervical check near your due date to see if you have effaced. At around 36 weeks, your health care provider will check your cervix. At the same time, your health care provider will also perform a test on the secretions of the vaginal tissue. This test is to determine if a type of bacteria, Group B streptococcus, is present. Your health care provider will explain this further. Your health care provider may ask you:    What your birth plan is.  How you are feeling.  If you are feeling the baby move.  If you have had any abnormal symptoms, such as leaking fluid, bleeding, severe headaches, or abdominal cramping.  If you are using any tobacco products, including cigarettes, chewing tobacco, and electronic cigarettes.  If you have any questions. Other tests or screenings that may be performed during your third trimester include:  Blood tests that check for low iron levels (anemia).  Fetal testing to check the health,  activity level, and growth of the fetus. Testing is done if you have certain medical conditions or if there are problems during the pregnancy.  Nonstress test (NST). This test checks the health of your baby to make sure there are no signs of problems, such as the baby not getting enough oxygen. During this test, a belt is placed around your belly. The baby is made to move, and its heart rate is monitored during movement. What is false labor? False labor is a condition in which you feel small, irregular tightenings of the muscles in the womb (contractions) that eventually go away. These are called Braxton Hicks contractions. Contractions may last for hours, days, or even weeks before true labor sets in. If contractions come at regular intervals, become more frequent, increase in intensity, or become painful, you should see your health care provider. What are the signs of labor?  Abdominal cramps.  Regular contractions that start at 10 minutes apart and become stronger and more frequent with time.  Contractions that start on the top of the uterus and spread down to the lower abdomen and back.  Increased pelvic pressure and dull back pain.  A watery or bloody mucus discharge that comes from the vagina.  Leaking of amniotic fluid. This is also known as your "water breaking." It could be a slow trickle or a gush. Let your doctor know if it has a color or strange odor. If you have any of these signs, call your health care provider right away, even if it is before your due date. Follow these instructions at home: Eating and drinking  Continue to eat regular, healthy meals.  Do not eat:  Raw meat or meat spreads.  Unpasteurized milk or cheese.  Unpasteurized juice.  Store-made salad.  Refrigerated smoked seafood.  Hot dogs or deli meat, unless they are piping hot.  More than 6 ounces of albacore tuna a week.  Shark, swordfish, king mackerel, or tile fish.  Store-made salads.  Raw  sprouts, such as mung bean or alfalfa sprouts.  Take prenatal vitamins as told by your health care provider.  Take 1000 mg of calcium daily as told by your health care provider.  If you develop constipation:  Take over-the-counter or prescription medicines.  Drink enough fluid to keep your urine clear or pale yellow.  Eat foods that are high in fiber, such as fresh fruits and vegetables, whole grains, and beans.  Limit foods that are high in fat and processed sugars, such as fried and sweet foods. Activity  Exercise only as directed by your health care provider. Healthy pregnant women should aim for 2 hours and 30 minutes of moderate exercise per week. If you experience any pain or discomfort while exercising, stop.  Avoid heavy lifting.  Do not exercise in extreme heat or humidity, or at high altitudes.  Wear low-heel, comfortable shoes.  Practice good posture.  Do not travel far distances unless it is absolutely necessary and only with the approval   of your health care provider.  Wear your seat belt at all times while in a car, on a bus, or on a plane.  Take frequent breaks and rest with your legs elevated if you have leg cramps or low back pain.  Do not use hot tubs, steam rooms, or saunas.  You may continue to have sex unless your health care provider tells you otherwise. Lifestyle  Do not use any products that contain nicotine or tobacco, such as cigarettes and e-cigarettes. If you need help quitting, ask your health care provider.  Do not drink alcohol.  Do not use any medicinal herbs or unprescribed drugs. These chemicals affect the formation and growth of the baby.  If you develop varicose veins:  Wear support pantyhose or compression stockings as told by your healthcare provider.  Elevate your feet for 15 minutes, 3-4 times a day.  Wear a supportive maternity bra to help with breast tenderness. General instructions  Take over-the-counter and prescription  medicines only as told by your health care provider. There are medicines that are either safe or unsafe to take during pregnancy.  Take warm sitz baths to soothe any pain or discomfort caused by hemorrhoids. Use hemorrhoid cream or witch hazel if your health care provider approves.  Avoid cat litter boxes and soil used by cats. These carry germs that can cause birth defects in the baby. If you have a cat, ask someone to clean the litter box for you.  To prepare for the arrival of your baby:  Take prenatal classes to understand, practice, and ask questions about the labor and delivery.  Make a trial run to the hospital.  Visit the hospital and tour the maternity area.  Arrange for maternity or paternity leave through employers.  Arrange for family and friends to take care of pets while you are in the hospital.  Purchase a rear-facing car seat and make sure you know how to install it in your car.  Pack your hospital bag.  Prepare the baby's nursery. Make sure to remove all pillows and stuffed animals from the baby's crib to prevent suffocation.  Visit your dentist if you have not gone during your pregnancy. Use a soft toothbrush to brush your teeth and be gentle when you floss.  Keep all prenatal follow-up visits as told by your health care provider. This is important. Contact a health care provider if:  You are unsure if you are in labor or if your water has broken.  You become dizzy.  You have mild pelvic cramps, pelvic pressure, or nagging pain in your abdominal area.  You have lower back pain.  You have persistent nausea, vomiting, or diarrhea.  You have an unusual or bad smelling vaginal discharge.  You have pain when you urinate. Get help right away if:  You have a fever.  You are leaking fluid from your vagina.  You have spotting or bleeding from your vagina.  You have severe abdominal pain or cramping.  You have rapid weight loss or weight gain.  You have  shortness of breath with chest pain.  You notice sudden or extreme swelling of your face, hands, ankles, feet, or legs.  Your baby makes fewer than 10 movements in 2 hours.  You have severe headaches that do not go away with medicine.  You have vision changes. Summary  The third trimester is from week 29 through week 40, months 7 through 9. The third trimester is a time when the unborn baby (fetus)   is growing rapidly.  During the third trimester, your discomfort may increase as you and your baby continue to gain weight. You may have abdominal, leg, and back pain, sleeping problems, and an increased need to urinate.  During the third trimester your breasts will keep growing and they will continue to become tender. A yellow fluid (colostrum) may leak from your breasts. This is the first milk you are producing for your baby.  False labor is a condition in which you feel small, irregular tightenings of the muscles in the womb (contractions) that eventually go away. These are called Braxton Hicks contractions. Contractions may last for hours, days, or even weeks before true labor sets in.  Signs of labor can include: abdominal cramps; regular contractions that start at 10 minutes apart and become stronger and more frequent with time; watery or bloody mucus discharge that comes from the vagina; increased pelvic pressure and dull back pain; and leaking of amniotic fluid. This information is not intended to replace advice given to you by your health care provider. Make sure you discuss any questions you have with your health care provider. Document Released: 02/09/2001 Document Revised: 07/24/2015 Document Reviewed: 04/18/2012 Elsevier Interactive Patient Education  2017 [employer]  

## 2016-03-05 LAB — CERVICOVAGINAL ANCILLARY ONLY
BACTERIAL VAGINITIS: NEGATIVE
CANDIDA VAGINITIS: NEGATIVE
CHLAMYDIA, DNA PROBE: NEGATIVE
Neisseria Gonorrhea: NEGATIVE
Trichomonas: NEGATIVE

## 2016-03-06 LAB — STREP GP B NAA: Strep Gp B NAA: NEGATIVE

## 2016-03-08 ENCOUNTER — Other Ambulatory Visit: Payer: Self-pay | Admitting: Certified Nurse Midwife

## 2016-03-08 DIAGNOSIS — Z3480 Encounter for supervision of other normal pregnancy, unspecified trimester: Secondary | ICD-10-CM

## 2016-03-15 ENCOUNTER — Ambulatory Visit (INDEPENDENT_AMBULATORY_CARE_PROVIDER_SITE_OTHER): Payer: Medicaid Other | Admitting: Obstetrics and Gynecology

## 2016-03-15 VITALS — BP 119/81 | HR 94 | Wt 254.0 lb

## 2016-03-15 DIAGNOSIS — O09893 Supervision of other high risk pregnancies, third trimester: Secondary | ICD-10-CM

## 2016-03-15 DIAGNOSIS — Z283 Underimmunization status: Secondary | ICD-10-CM

## 2016-03-15 DIAGNOSIS — Z3483 Encounter for supervision of other normal pregnancy, third trimester: Secondary | ICD-10-CM

## 2016-03-15 DIAGNOSIS — Z3480 Encounter for supervision of other normal pregnancy, unspecified trimester: Secondary | ICD-10-CM

## 2016-03-15 MED ORDER — AZITHROMYCIN 250 MG PO TABS: ORAL_TABLET | ORAL | 0 refills | Status: DC

## 2016-03-15 NOTE — Progress Notes (Signed)
Patient complains of sinus pressure, stuffy nose despite taking OTC meds,

## 2016-03-15 NOTE — Progress Notes (Signed)
   PRENATAL VISIT NOTE  Subjective:  Michele Wells is a 31 y.o. G2P0010 at 5323w4d being seen today for ongoing prenatal care.  She is currently monitored for the following issues for this low-risk pregnancy and has Supervision of other normal pregnancy, antepartum and Susceptible to Varicella (non-immune), currently pregnant in third trimester on her problem list.  Patient reports nasal congestion for the past week with greenish discharge.  Contractions: Irregular. Vag. Bleeding: None.  Movement: Present. Denies leaking of fluid.   The following portions of the patient's history were reviewed and updated as appropriate: allergies, current medications, past family history, past medical history, past social history, past surgical history and problem list. Problem list updated.  Objective:   Vitals:   03/15/16 1417  BP: 119/81  Pulse: 94  Weight: 254 lb (115.2 kg)    Fetal Status: Fetal Heart Rate (bpm): 163   Movement: Present     General:  Alert, oriented and cooperative. Patient is in no acute distress.  Skin: Skin is warm and dry. No rash noted.   Cardiovascular: Normal heart rate noted  Respiratory: Normal respiratory effort, no problems with respiration noted  Abdomen: Soft, gravid, appropriate for gestational age. Pain/Pressure: Present     Pelvic:  Cervical exam deferred        Extremities: Normal range of motion.  Edema: Trace  Mental Status: Normal mood and affect. Normal behavior. Normal judgment and thought content.   Assessment and Plan:  Pregnancy: G2P0010 at 1123w4d  1. Supervision of other normal pregnancy, antepartum Patient is doing well Rx z-pack provided for sinusitis Reviewed pelvic culture results with the patient  2. Susceptible to Varicella (non-immune), currently pregnant in third trimester Will offer pp  Preterm labor symptoms and general obstetric precautions including but not limited to vaginal bleeding, contractions, leaking of fluid and fetal  movement were reviewed in detail with the patient. Please refer to After Visit Summary for other counseling recommendations.  Return in about 1 week (around 03/22/2016).   Catalina AntiguaPeggy Chyann Ambrocio, MD

## 2016-03-24 ENCOUNTER — Ambulatory Visit (INDEPENDENT_AMBULATORY_CARE_PROVIDER_SITE_OTHER): Payer: Medicaid Other | Admitting: Obstetrics & Gynecology

## 2016-03-24 DIAGNOSIS — Z3483 Encounter for supervision of other normal pregnancy, third trimester: Secondary | ICD-10-CM

## 2016-03-24 DIAGNOSIS — Z3480 Encounter for supervision of other normal pregnancy, unspecified trimester: Secondary | ICD-10-CM

## 2016-03-24 NOTE — Progress Notes (Signed)
   PRENATAL VISIT NOTE  Subjective:  Michele Wells is a 31 y.o. G2P0010 at 7821w6d being seen today for ongoing prenatal care.  She is currently monitored for the following issues for this low-risk pregnancy and has Supervision of other normal pregnancy, antepartum and Susceptible to Varicella (non-immune), currently pregnant in third trimester on her problem list.  Patient reports occasional contractions.  Contractions: Irregular. Vag. Bleeding: None.  Movement: Present. Denies leaking of fluid.   The following portions of the patient's history were reviewed and updated as appropriate: allergies, current medications, past family history, past medical history, past social history, past surgical history and problem list. Problem list updated.  Objective:   Vitals:   03/24/16 0833  BP: 114/80  Pulse: 73  Weight: 254 lb (115.2 kg)    Fetal Status: Fetal Heart Rate (bpm): 141 Fundal Height: 37 cm Movement: Present     General:  Alert, oriented and cooperative. Patient is in no acute distress.  Skin: Skin is warm and dry. No rash noted.   Cardiovascular: Normal heart rate noted  Respiratory: Normal respiratory effort, no problems with respiration noted  Abdomen: Soft, gravid, appropriate for gestational age. Pain/Pressure: Present     Pelvic:  Cervical exam deferred        Extremities: Normal range of motion.  Edema: Trace  Mental Status: Normal mood and affect. Normal behavior. Normal judgment and thought content.   Assessment and Plan:  Pregnancy: G2P0010 at 4821w6d  1. Supervision of other normal pregnancy, antepartum Doing well   Term labor symptoms and general obstetric precautions including but not limited to vaginal bleeding, contractions, leaking of fluid and fetal movement were reviewed in detail with the patient. Please refer to After Visit Summary for other counseling recommendations.  Return in about 1 week (around 03/31/2016).   Adam PhenixJames G Arnold, MD

## 2016-03-24 NOTE — Progress Notes (Signed)
Patient is in the office, reports good fetal movement. Patient states that on Friday she was dizzy and seeing spots, patient took BP and it was 117/65, but reported that she did not really eat that day, but symptoms are better today.

## 2016-03-24 NOTE — Patient Instructions (Signed)

## 2016-03-30 ENCOUNTER — Ambulatory Visit (INDEPENDENT_AMBULATORY_CARE_PROVIDER_SITE_OTHER): Payer: Medicaid Other | Admitting: Obstetrics & Gynecology

## 2016-03-30 DIAGNOSIS — Z3480 Encounter for supervision of other normal pregnancy, unspecified trimester: Secondary | ICD-10-CM

## 2016-03-30 DIAGNOSIS — Z3483 Encounter for supervision of other normal pregnancy, third trimester: Secondary | ICD-10-CM

## 2016-03-30 NOTE — Progress Notes (Signed)
   PRENATAL VISIT NOTE  Subjective:  Michele Wells is a 31 y.o. G2P0010 at 3958w5d being seen today for ongoing prenatal care.  She is currently monitored for the following issues for this low-risk pregnancy and has Supervision of other normal pregnancy, antepartum and Susceptible to Varicella (non-immune), currently pregnant in third trimester on her problem list.  Patient reports no complaints.  Contractions: Irregular. Vag. Bleeding: None.  Movement: Present. Denies leaking of fluid.   The following portions of the patient's history were reviewed and updated as appropriate: allergies, current medications, past family history, past medical history, past social history, past surgical history and problem list. Problem list updated.  Objective:   Vitals:   03/30/16 1558  BP: 113/73  Pulse: 84  Temp: 97.6 F (36.4 C)  Weight: 257 lb 4.8 oz (116.7 kg)    Fetal Status: Fetal Heart Rate (bpm): 145 Fundal Height: 39 cm Movement: Present     General:  Alert, oriented and cooperative. Patient is in no acute distress.  Skin: Skin is warm and dry. No rash noted.   Cardiovascular: Normal heart rate noted  Respiratory: Normal respiratory effort, no problems with respiration noted  Abdomen: Soft, gravid, appropriate for gestational age. Pain/Pressure: Present     Pelvic:  Cervical exam deferred        Extremities: Normal range of motion.  Edema: Trace  Mental Status: Normal mood and affect. Normal behavior. Normal judgment and thought content.   Assessment and Plan:  Pregnancy: G2P0010 at 6858w5d  1. Supervision of other normal pregnancy, antepartum Term labor symptoms and general obstetric precautions including but not limited to vaginal bleeding, contractions, leaking of fluid and fetal movement were reviewed in detail with the patient. Please refer to After Visit Summary for other counseling recommendations.  Return in about 1 week (around 04/06/2016) for OB Visit.   Tereso NewcomerUgonna A Shiela Bruns,  MD

## 2016-03-30 NOTE — Patient Instructions (Signed)
Return to clinic for any scheduled appointments or obstetric concerns, or go to MAU for evaluation  

## 2016-03-31 ENCOUNTER — Encounter: Payer: Medicaid Other | Admitting: Obstetrics and Gynecology

## 2016-04-08 ENCOUNTER — Inpatient Hospital Stay (HOSPITAL_COMMUNITY)
Admission: AD | Admit: 2016-04-08 | Discharge: 2016-04-09 | Disposition: A | Discharge status: Home / Self Care | Payer: Medicaid Other | Source: Ambulatory Visit | Attending: Obstetrics and Gynecology | Admitting: Obstetrics and Gynecology

## 2016-04-08 ENCOUNTER — Encounter (HOSPITAL_COMMUNITY): Payer: Self-pay | Admitting: *Deleted

## 2016-04-08 ENCOUNTER — Ambulatory Visit (INDEPENDENT_AMBULATORY_CARE_PROVIDER_SITE_OTHER): Payer: Medicaid Other | Admitting: Obstetrics and Gynecology

## 2016-04-08 VITALS — BP 127/78 | HR 86 | Temp 97.3°F | Wt 255.5 lb

## 2016-04-08 DIAGNOSIS — Z283 Underimmunization status: Secondary | ICD-10-CM

## 2016-04-08 DIAGNOSIS — Z0371 Encounter for suspected problem with amniotic cavity and membrane ruled out: Secondary | ICD-10-CM

## 2016-04-08 DIAGNOSIS — Z8249 Family history of ischemic heart disease and other diseases of the circulatory system: Secondary | ICD-10-CM | POA: Insufficient documentation

## 2016-04-08 DIAGNOSIS — Z833 Family history of diabetes mellitus: Secondary | ICD-10-CM | POA: Insufficient documentation

## 2016-04-08 DIAGNOSIS — O99343 Other mental disorders complicating pregnancy, third trimester: Secondary | ICD-10-CM | POA: Insufficient documentation

## 2016-04-08 DIAGNOSIS — Z87891 Personal history of nicotine dependence: Secondary | ICD-10-CM | POA: Insufficient documentation

## 2016-04-08 DIAGNOSIS — O09893 Supervision of other high risk pregnancies, third trimester: Secondary | ICD-10-CM

## 2016-04-08 DIAGNOSIS — Z3483 Encounter for supervision of other normal pregnancy, third trimester: Secondary | ICD-10-CM

## 2016-04-08 DIAGNOSIS — F419 Anxiety disorder, unspecified: Secondary | ICD-10-CM | POA: Insufficient documentation

## 2016-04-08 DIAGNOSIS — Z3A40 40 weeks gestation of pregnancy: Secondary | ICD-10-CM | POA: Insufficient documentation

## 2016-04-08 DIAGNOSIS — Z809 Family history of malignant neoplasm, unspecified: Secondary | ICD-10-CM | POA: Insufficient documentation

## 2016-04-08 DIAGNOSIS — Z9889 Other specified postprocedural states: Secondary | ICD-10-CM | POA: Insufficient documentation

## 2016-04-08 DIAGNOSIS — Z79899 Other long term (current) drug therapy: Secondary | ICD-10-CM | POA: Insufficient documentation

## 2016-04-08 DIAGNOSIS — Z3480 Encounter for supervision of other normal pregnancy, unspecified trimester: Secondary | ICD-10-CM

## 2016-04-08 NOTE — MAU Note (Signed)
PT  SAYS SHE  GETS  PNC  WITH  FAMINA-   WAS IN OFFICE    TODAY   - STRIPPED  MEMBRANES   VE  1  CM.     HAS HAD  BLOODY SHOW- NONE  NOW.    HAD   SEX     AT 945 PM.  THEN  AT 2300-  WHEN  SHE WIPED - SAW  BLOODY MUCUS.   -   HAS FELT  LIKE  VOIDING  RUNNING  OUT.    DENIES  HSV AND  MRSA.  GBS-  NEG

## 2016-04-08 NOTE — Progress Notes (Signed)
Patient reports good fetal movement. 

## 2016-04-08 NOTE — Progress Notes (Signed)
   PRENATAL VISIT NOTE  Subjective:  Michele Wells is a 31 y.o. G2P0010 at 1754w0d being seen today for ongoing prenatal care.  She is currently monitored for the following issues for this low-risk pregnancy and has Supervision of other normal pregnancy, antepartum and Susceptible to Varicella (non-immune), currently pregnant in third trimester on her problem list.  Patient reports no complaints.  Contractions: Irregular. Vag. Bleeding: None.  Movement: Present. Denies leaking of fluid.   The following portions of the patient's history were reviewed and updated as appropriate: allergies, current medications, past family history, past medical history, past social history, past surgical history and problem list. Problem list updated.  Objective:   Vitals:   04/08/16 1047  BP: 127/78  Pulse: 86  Temp: 97.3 F (36.3 C)  Weight: 255 lb 8 oz (115.9 kg)    Fetal Status: Fetal Heart Rate (bpm): 152 Fundal Height: 40 cm Movement: Present  Presentation: Vertex  General:  Alert, oriented and cooperative. Patient is in no acute distress.  Skin: Skin is warm and dry. No rash noted.   Cardiovascular: Normal heart rate noted  Respiratory: Normal respiratory effort, no problems with respiration noted  Abdomen: Soft, gravid, appropriate for gestational age. Pain/Pressure: Present     Pelvic:  Cervical exam performed Dilation: 1 Effacement (%): 50 Station: -3  Extremities: Normal range of motion.  Edema: Trace  Mental Status: Normal mood and affect. Normal behavior. Normal judgment and thought content.   Assessment and Plan:  Pregnancy: G2P0010 at 5554w0d  1. Supervision of other normal pregnancy, antepartum Patient is doing well without complaints Plan for postdate fetal testing on Monday with IOL on Thursday Answered questions regarding induction process  2. Susceptible to Varicella (non-immune), currently pregnant in third trimester Will offer pp  Term labor symptoms and general obstetric  precautions including but not limited to vaginal bleeding, contractions, leaking of fluid and fetal movement were reviewed in detail with the patient. Please refer to After Visit Summary for other counseling recommendations.  Return in about 4 days (around 04/12/2016) for ROB with NST.   Catalina AntiguaPeggy Arianne Klinge, MD

## 2016-04-09 ENCOUNTER — Encounter (HOSPITAL_COMMUNITY): Payer: Self-pay

## 2016-04-09 ENCOUNTER — Telehealth (HOSPITAL_COMMUNITY): Payer: Self-pay | Admitting: *Deleted

## 2016-04-09 DIAGNOSIS — Z3A40 40 weeks gestation of pregnancy: Secondary | ICD-10-CM

## 2016-04-09 DIAGNOSIS — O99343 Other mental disorders complicating pregnancy, third trimester: Secondary | ICD-10-CM | POA: Diagnosis not present

## 2016-04-09 DIAGNOSIS — Z87891 Personal history of nicotine dependence: Secondary | ICD-10-CM | POA: Diagnosis not present

## 2016-04-09 DIAGNOSIS — Z809 Family history of malignant neoplasm, unspecified: Secondary | ICD-10-CM | POA: Diagnosis not present

## 2016-04-09 DIAGNOSIS — Z833 Family history of diabetes mellitus: Secondary | ICD-10-CM | POA: Diagnosis not present

## 2016-04-09 DIAGNOSIS — O42112 Preterm premature rupture of membranes, onset of labor more than 24 hours following rupture, second trimester: Secondary | ICD-10-CM

## 2016-04-09 DIAGNOSIS — F419 Anxiety disorder, unspecified: Secondary | ICD-10-CM | POA: Diagnosis not present

## 2016-04-09 DIAGNOSIS — Z79899 Other long term (current) drug therapy: Secondary | ICD-10-CM | POA: Diagnosis not present

## 2016-04-09 DIAGNOSIS — Z9889 Other specified postprocedural states: Secondary | ICD-10-CM | POA: Diagnosis not present

## 2016-04-09 DIAGNOSIS — Z8249 Family history of ischemic heart disease and other diseases of the circulatory system: Secondary | ICD-10-CM | POA: Diagnosis not present

## 2016-04-09 LAB — POCT FERN TEST: POCT Fern Test: NEGATIVE

## 2016-04-09 NOTE — MAU Provider Note (Signed)
First Provider Initiated Contact with Patient 04/09/16 0116      Chief Complaint:  Rupture of Membranes   Michele MakiJessica L Wells is  31 y.o. G2P0010 at 4825w1d presents complaining of Rupture of Membranes .  Per RN:  Pt states that she had membranes stripped this morning. Noticed some blood-has stopped. Had intercourse tonight and afterwards had some clear fluid. Has leaked some but has not saturated a pad. Denies pain. +FM. Cervix was 1cm today in the office  Obstetrical/Gynecological History: OB History    Gravida Para Term Preterm AB Living   2 0 0 0 1 0   SAB TAB Ectopic Multiple Live Births   1 0 0 0 0     Past Medical History: Past Medical History:  Diagnosis Date  . Anxiety     Past Surgical History: Past Surgical History:  Procedure Laterality Date  . APPENDECTOMY      Family History: Family History  Problem Relation Age of Onset  . Cancer Maternal Grandmother   . Diabetes Maternal Grandmother   . Cancer Maternal Grandfather   . Heart disease Paternal Grandfather     Social History: Social History  Substance Use Topics  . Smoking status: Former Games developermoker  . Smokeless tobacco: Never Used  . Alcohol use No    Allergies: No Known Allergies  Meds:  Prescriptions Prior to Admission  Medication Sig Dispense Refill Last Dose  . doxylamine, Sleep, (UNISOM) 25 MG tablet Take 25 mg by mouth at bedtime as needed.   04/09/2016 at Unknown time  . Prenatal Vit-Fe Phos-FA-Omega (VITAFOL GUMMIES PO) Take by mouth.   04/09/2016 at Unknown time  . azithromycin (ZITHROMAX Z-PAK) 250 MG tablet 500 mg on day 1, followed by 250 mg daily for 4 days (Patient not taking: Reported on 03/24/2016) 6 each 0 Not Taking    Review of Systems   Constitutional: Negative for fever and chills Eyes: Negative for visual disturbances Respiratory: Negative for shortness of breath, dyspnea Cardiovascular: Negative for chest pain or palpitations  Gastrointestinal: Negative for vomiting, diarrhea and  constipation Genitourinary: Negative for dysuria and urgency Musculoskeletal: Negative for back pain, joint pain, myalgias.  Normal ROM  Neurological: Negative for dizziness and headaches    Physical Exam  Blood pressure 118/70, pulse 84, temperature 99.8 F (37.7 C), temperature source Oral, resp. rate 20, height 5\' 6"  (1.676 m), weight 116.7 kg (257 lb 4 oz), last menstrual period 07/03/2015. GENERAL: Well-developed, well-nourished female in no acute distress.  LUNGS: Clear to auscultation bilaterally.  HEART: Regular rate and rhythm. ABDOMEN: Soft, nontender, nondistended, gravid.  EXTREMITIES: Nontender, no edema, 2+ distal pulses. DTR's 2+ SSE:  Normal appearing DC, some blood  NEgative pooling/fern/valsalva.  CERVICAL EXAM: Dilatation 1cm   Effacement thick%   Station 2   Presentation: cephalic FHT:  Baseline rate 150 bpm   Variability moderate  Accelerations present   Decelerations none Contractions: Every 0 mins   Labs: No results found for this or any previous visit (from the past 24 hour(s)). Imaging Studies:  No results found.  Assessment: Michele Wells is  31 y.o. G2P0010 at 1825w1d presents with no ROM.  Plan: DC h ome  CRESENZO-DISHMAN,Jamaine Quintin 2/9/20181:23 AM

## 2016-04-09 NOTE — Telephone Encounter (Signed)
Preadmission screen  

## 2016-04-09 NOTE — Discharge Instructions (Signed)
Braxton Hicks Contractions °Contractions of the uterus can occur throughout pregnancy. Contractions are not always a sign that you are in labor.  °WHAT ARE BRAXTON HICKS CONTRACTIONS?  °Contractions that occur before labor are called Braxton Hicks contractions, or false labor. Toward the end of pregnancy (32-34 weeks), these contractions can develop more often and may become more forceful. This is not true labor because these contractions do not result in opening (dilatation) and thinning of the cervix. They are sometimes difficult to tell apart from true labor because these contractions can be forceful and people have different pain tolerances. You should not feel embarrassed if you go to the hospital with false labor. Sometimes, the only way to tell if you are in true labor is for your health care provider to look for changes in the cervix. °If there are no prenatal problems or other health problems associated with the pregnancy, it is completely safe to be sent home with false labor and await the onset of true labor. °HOW CAN YOU TELL THE DIFFERENCE BETWEEN TRUE AND FALSE LABOR? °False Labor  °· The contractions of false labor are usually shorter and not as hard as those of true labor.   °· The contractions are usually irregular.   °· The contractions are often felt in the front of the lower abdomen and in the groin.   °· The contractions may go away when you walk around or change positions while lying down.   °· The contractions get weaker and are shorter lasting as time goes on.   °· The contractions do not usually become progressively stronger, regular, and closer together as with true labor.   °True Labor  °· Contractions in true labor last 30-70 seconds, become very regular, usually become more intense, and increase in frequency.   °· The contractions do not go away with walking.   °· The discomfort is usually felt in the top of the uterus and spreads to the lower abdomen and low back.   °· True labor can be  determined by your health care provider with an exam. This will show that the cervix is dilating and getting thinner.   °WHAT TO REMEMBER °· Keep up with your usual exercises and follow other instructions given by your health care provider.   °· Take medicines as directed by your health care provider.   °· Keep your regular prenatal appointments.   °· Eat and drink lightly if you think you are going into labor.   °· If Braxton Hicks contractions are making you uncomfortable:   °¨ Change your position from lying down or resting to walking, or from walking to resting.   °¨ Sit and rest in a tub of warm water.   °¨ Drink 2-3 glasses of water. Dehydration may cause these contractions.   °¨ Do slow and deep breathing several times an hour.   °WHEN SHOULD I SEEK IMMEDIATE MEDICAL CARE? °Seek immediate medical care if: °· Your contractions become stronger, more regular, and closer together.   °· You have fluid leaking or gushing from your vagina.   °· You have a fever.   °· You pass blood-tinged mucus.   °· You have vaginal bleeding.   °· You have continuous abdominal pain.   °· You have low back pain that you never had before.   °· You feel your baby's head pushing down and causing pelvic pressure.   °· Your baby is not moving as much as it used to.   °This information is not intended to replace advice given to you by your health care provider. Make sure you discuss any questions you have with your health care   provider. °Document Released: 02/15/2005 Document Revised: 06/09/2015 Document Reviewed: 11/27/2012 °Elsevier Interactive Patient Education © 2017 [employer] ° °

## 2016-04-09 NOTE — MAU Note (Addendum)
Pt states that she had membranes stripped this morning. Noticed some blood-has stopped. Had intercourse tonight and afterwards had some clear fluid. Has leaked some but has not saturated a pad. Denies pain. +FM. Cervix was 1cm today in the office.

## 2016-04-12 ENCOUNTER — Ambulatory Visit (INDEPENDENT_AMBULATORY_CARE_PROVIDER_SITE_OTHER): Payer: Medicaid Other | Admitting: Obstetrics and Gynecology

## 2016-04-12 VITALS — BP 136/85 | HR 100 | Wt 256.8 lb

## 2016-04-12 DIAGNOSIS — O480 Post-term pregnancy: Secondary | ICD-10-CM | POA: Diagnosis not present

## 2016-04-12 DIAGNOSIS — Z3480 Encounter for supervision of other normal pregnancy, unspecified trimester: Secondary | ICD-10-CM

## 2016-04-12 DIAGNOSIS — O09893 Supervision of other high risk pregnancies, third trimester: Secondary | ICD-10-CM

## 2016-04-12 DIAGNOSIS — Z283 Underimmunization status: Secondary | ICD-10-CM

## 2016-04-12 NOTE — Progress Notes (Signed)
   PRENATAL VISIT NOTE  Subjective:  Michele Wells is a 31 y.o. G2P0010 at 3730w4d being seen today for ongoing prenatal care.  She is currently monitored for the following issues for this low-risk pregnancy and has Supervision of other normal pregnancy, antepartum and Susceptible to Varicella (non-immune), currently pregnant in third trimester on her problem list.  Patient reports no complaints.  Contractions: Irregular. Vag. Bleeding: None.  Movement: (!) Decreased. Denies leaking of fluid.   The following portions of the patient's history were reviewed and updated as appropriate: allergies, current medications, past family history, past medical history, past social history, past surgical history and problem list. Problem list updated.  Objective:   Vitals:   04/12/16 1324  BP: 136/85  Pulse: 100  Weight: 256 lb 12.8 oz (116.5 kg)    Fetal Status: Fetal Heart Rate (bpm): nst   Movement: (!) Decreased  Presentation: Vertex  General:  Alert, oriented and cooperative. Patient is in no acute distress.  Skin: Skin is warm and dry. No rash noted.   Cardiovascular: Normal heart rate noted  Respiratory: Normal respiratory effort, no problems with respiration noted  Abdomen: Soft, gravid, appropriate for gestational age. Pain/Pressure: Present     Pelvic:  Cervical exam performed Dilation: 1 Effacement (%): 50 Station: -3  Extremities: Normal range of motion.  Edema: Mild pitting, slight indentation  Mental Status: Normal mood and affect. Normal behavior. Normal judgment and thought content.   Assessment and Plan:  Pregnancy: G2P0010 at 5330w4d  1. Supervision of other normal pregnancy, antepartum Patient is doing well without complaints NST reviewed and reactive with baseline 135, mod variability, +accels, no decels Toco- no contractions Bedside ultrasound performed Pt informed that the ultrasound is considered a limited OB ultrasound and is not intended to be a complete ultrasound  exam.  Patient also informed that the ultrasound is not being completed with the intent of assessing for fetal or placental anomalies or any pelvic abnormalities.  Explained that the purpose of today's ultrasound is to assess for  AFI- 7.2.  Patient acknowledges the purpose of the exam and the limitations of the study.    Patient scheduled for IOL on 2/17 Patient to return for NST on Thursday  2. Susceptible to Varicella (non-immune), currently pregnant in third trimester Will offer pp  Term labor symptoms and general obstetric precautions including but not limited to vaginal bleeding, contractions, leaking of fluid and fetal movement were reviewed in detail with the patient. Please refer to After Visit Summary for other counseling recommendations.  Return in about 3 days (around 04/15/2016) for NST only.   Catalina AntiguaPeggy Lachae Hohler, MD

## 2016-04-15 ENCOUNTER — Other Ambulatory Visit: Payer: Self-pay | Admitting: Advanced Practice Midwife

## 2016-04-15 ENCOUNTER — Ambulatory Visit (INDEPENDENT_AMBULATORY_CARE_PROVIDER_SITE_OTHER): Payer: Medicaid Other

## 2016-04-15 VITALS — BP 130/80 | HR 101 | Wt 256.0 lb

## 2016-04-15 DIAGNOSIS — O480 Post-term pregnancy: Secondary | ICD-10-CM

## 2016-04-15 NOTE — Progress Notes (Signed)
Patient NST done for post dates. Patient is 41 weeks today and is scheduled for induction on 04-17-16. Armandina StammerJennifer Howard RN BSN

## 2016-04-16 ENCOUNTER — Other Ambulatory Visit: Payer: Self-pay | Admitting: Advanced Practice Midwife

## 2016-04-17 ENCOUNTER — Inpatient Hospital Stay (HOSPITAL_COMMUNITY)
Admission: RE | Admit: 2016-04-17 | Discharge: 2016-04-19 | DRG: 775 | Disposition: A | Discharge status: Home / Self Care | Payer: Medicaid Other | Source: Ambulatory Visit | Attending: Obstetrics and Gynecology | Admitting: Obstetrics and Gynecology

## 2016-04-17 ENCOUNTER — Encounter (HOSPITAL_COMMUNITY): Payer: Self-pay

## 2016-04-17 DIAGNOSIS — Z3A41 41 weeks gestation of pregnancy: Secondary | ICD-10-CM | POA: Diagnosis not present

## 2016-04-17 DIAGNOSIS — O09893 Supervision of other high risk pregnancies, third trimester: Secondary | ICD-10-CM

## 2016-04-17 DIAGNOSIS — Z283 Underimmunization status: Secondary | ICD-10-CM

## 2016-04-17 DIAGNOSIS — O480 Post-term pregnancy: Secondary | ICD-10-CM | POA: Diagnosis present

## 2016-04-17 DIAGNOSIS — Z87891 Personal history of nicotine dependence: Secondary | ICD-10-CM

## 2016-04-17 LAB — COMPREHENSIVE METABOLIC PANEL
ALBUMIN: 3.2 g/dL — AB (ref 3.5–5.0)
ALT: 19 U/L (ref 14–54)
ANION GAP: 10 (ref 5–15)
AST: 27 U/L (ref 15–41)
Alkaline Phosphatase: 115 U/L (ref 38–126)
BUN: 12 mg/dL (ref 6–20)
CO2: 20 mmol/L — AB (ref 22–32)
Calcium: 8.6 mg/dL — ABNORMAL LOW (ref 8.9–10.3)
Chloride: 104 mmol/L (ref 101–111)
Creatinine, Ser: 0.6 mg/dL (ref 0.44–1.00)
GFR calc Af Amer: >60 mL/min (ref >60)
GFR calc non Af Amer: >60 mL/min (ref >60)
GLUCOSE: 104 mg/dL — AB (ref 65–99)
Potassium: 3.8 mmol/L (ref 3.5–5.1)
Sodium: 134 mmol/L — ABNORMAL LOW (ref 135–145)
Total Bilirubin: 0.9 mg/dL (ref 0.3–1.2)
Total Protein: 7.2 g/dL (ref 6.5–8.1)

## 2016-04-17 LAB — CBC
HEMATOCRIT: 33.4 % — AB (ref 36.0–46.0)
Hemoglobin: 11.8 g/dL — ABNORMAL LOW (ref 12.0–15.0)
MCH: 30.1 pg (ref 26.0–34.0)
MCHC: 35.3 g/dL (ref 30.0–36.0)
MCV: 85.2 fL (ref 78.0–100.0)
Platelets: 283 10*3/uL (ref 150–400)
RBC: 3.92 MIL/uL (ref 3.87–5.11)
RDW: 15.2 % (ref 11.5–15.5)
WBC: 9.1 10*3/uL (ref 4.0–10.5)

## 2016-04-17 LAB — RPR: RPR Ser Ql: NONREACTIVE

## 2016-04-17 LAB — ABO/RH: ABO/RH(D): O POS

## 2016-04-17 LAB — PROTEIN / CREATININE RATIO, URINE
Creatinine, Urine: 58 mg/dL
PROTEIN CREATININE RATIO: 0.1 mg/mg{creat} (ref 0.00–0.15)
Total Protein, Urine: 6 mg/dL

## 2016-04-17 LAB — TYPE AND SCREEN
ABO/RH(D): O POS
ANTIBODY SCREEN: NEGATIVE

## 2016-04-17 MED ORDER — LACTATED RINGERS IV SOLN
500.0000 mL | INTRAVENOUS | Status: DC | PRN
  Administered 2016-04-17: 500 mL via INTRAVENOUS

## 2016-04-17 MED ORDER — ONDANSETRON HCL 4 MG/2ML IJ SOLN: 4.0000 mg | INTRAMUSCULAR | Status: DC | PRN

## 2016-04-17 MED ORDER — DIPHENHYDRAMINE HCL 50 MG/ML IJ SOLN: 12.5000 mg | INTRAMUSCULAR | Status: DC | PRN

## 2016-04-17 MED ORDER — ZOLPIDEM TARTRATE 5 MG PO TABS: 5.0000 mg | ORAL_TABLET | Freq: Every evening | ORAL | Status: DC | PRN

## 2016-04-17 MED ORDER — EPHEDRINE 5 MG/ML INJ
10.0000 mg | INTRAVENOUS | Status: DC | PRN
  Filled 2016-04-17: qty 4

## 2016-04-17 MED ORDER — MISOPROSTOL 25 MCG QUARTER TABLET
25.0000 ug | ORAL_TABLET | ORAL | Status: DC | PRN
  Administered 2016-04-17 (×2): 25 ug via VAGINAL
  Filled 2016-04-17 (×2): qty 0.25
  Filled 2016-04-17: qty 1

## 2016-04-17 MED ORDER — ZOLPIDEM TARTRATE 5 MG PO TABS
5.0000 mg | ORAL_TABLET | Freq: Every evening | ORAL | Status: DC | PRN
Start: 2016-04-17 — End: 2016-04-19

## 2016-04-17 MED ORDER — BENZOCAINE-MENTHOL 20-0.5 % EX AERO: 1.0000 "application " | INHALATION_SPRAY | CUTANEOUS | Status: DC | PRN

## 2016-04-17 MED ORDER — FENTANYL 2.5 MCG/ML BUPIVACAINE 1/10 % EPIDURAL INFUSION (WH - ANES)
INTRAMUSCULAR | Status: AC
  Filled 2016-04-17: qty 100

## 2016-04-17 MED ORDER — WITCH HAZEL-GLYCERIN EX PADS: 1.0000 "application " | MEDICATED_PAD | CUTANEOUS | Status: DC | PRN

## 2016-04-17 MED ORDER — OXYCODONE-ACETAMINOPHEN 5-325 MG PO TABS: 1.0000 | ORAL_TABLET | ORAL | Status: DC | PRN

## 2016-04-17 MED ORDER — PHENYLEPHRINE 40 MCG/ML (10ML) SYRINGE FOR IV PUSH (FOR BLOOD PRESSURE SUPPORT)
PREFILLED_SYRINGE | INTRAVENOUS | Status: AC
  Filled 2016-04-17: qty 20

## 2016-04-17 MED ORDER — SOD CITRATE-CITRIC ACID 500-334 MG/5ML PO SOLN: 30.0000 mL | ORAL | Status: DC | PRN

## 2016-04-17 MED ORDER — ONDANSETRON HCL 4 MG PO TABS: 4.0000 mg | ORAL_TABLET | ORAL | Status: DC | PRN

## 2016-04-17 MED ORDER — OXYTOCIN 40 UNITS IN LACTATED RINGERS INFUSION - SIMPLE MED
1.0000 m[IU]/min | INTRAVENOUS | Status: DC
  Administered 2016-04-17: 2 m[IU]/min via INTRAVENOUS
  Filled 2016-04-17: qty 1000

## 2016-04-17 MED ORDER — COCONUT OIL OIL
1.0000 "application " | TOPICAL_OIL | Status: DC | PRN
  Administered 2016-04-19: 1 via TOPICAL
  Filled 2016-04-17: qty 120

## 2016-04-17 MED ORDER — OXYTOCIN BOLUS FROM INFUSION
500.0000 mL | Freq: Once | INTRAVENOUS | Status: AC
  Administered 2016-04-17: 500 mL via INTRAVENOUS

## 2016-04-17 MED ORDER — LACTATED RINGERS IV SOLN: 500.0000 mL | Freq: Once | INTRAVENOUS | Status: DC

## 2016-04-17 MED ORDER — FENTANYL CITRATE (PF) 100 MCG/2ML IJ SOLN
100.0000 ug | INTRAMUSCULAR | Status: DC | PRN
  Administered 2016-04-17 (×4): 100 ug via INTRAVENOUS
  Filled 2016-04-17 (×4): qty 2

## 2016-04-17 MED ORDER — SIMETHICONE 80 MG PO CHEW: 80.0000 mg | CHEWABLE_TABLET | ORAL | Status: DC | PRN

## 2016-04-17 MED ORDER — PRENATAL MULTIVITAMIN CH
1.0000 | ORAL_TABLET | Freq: Every day | ORAL | Status: DC
  Administered 2016-04-18 – 2016-04-19 (×2): 1 via ORAL
  Filled 2016-04-17 (×2): qty 1

## 2016-04-17 MED ORDER — ACETAMINOPHEN 325 MG PO TABS
650.0000 mg | ORAL_TABLET | ORAL | Status: DC | PRN
Start: 2016-04-17 — End: 2016-04-19

## 2016-04-17 MED ORDER — OXYCODONE-ACETAMINOPHEN 5-325 MG PO TABS: 2.0000 | ORAL_TABLET | ORAL | Status: DC | PRN

## 2016-04-17 MED ORDER — DIPHENHYDRAMINE HCL 25 MG PO CAPS: 25.0000 mg | ORAL_CAPSULE | Freq: Four times a day (QID) | ORAL | Status: DC | PRN

## 2016-04-17 MED ORDER — DIBUCAINE 1 % RE OINT: 1.0000 "application " | TOPICAL_OINTMENT | RECTAL | Status: DC | PRN

## 2016-04-17 MED ORDER — PHENYLEPHRINE 40 MCG/ML (10ML) SYRINGE FOR IV PUSH (FOR BLOOD PRESSURE SUPPORT)
80.0000 ug | PREFILLED_SYRINGE | INTRAVENOUS | Status: DC | PRN
  Filled 2016-04-17: qty 5

## 2016-04-17 MED ORDER — ONDANSETRON HCL 4 MG/2ML IJ SOLN
4.0000 mg | Freq: Four times a day (QID) | INTRAMUSCULAR | Status: DC | PRN
  Administered 2016-04-17: 4 mg via INTRAVENOUS
  Filled 2016-04-17: qty 2

## 2016-04-17 MED ORDER — TETANUS-DIPHTH-ACELL PERTUSSIS 5-2.5-18.5 LF-MCG/0.5 IM SUSP: 0.5000 mL | Freq: Once | INTRAMUSCULAR | Status: DC

## 2016-04-17 MED ORDER — SENNOSIDES-DOCUSATE SODIUM 8.6-50 MG PO TABS
2.0000 | ORAL_TABLET | ORAL | Status: DC
  Administered 2016-04-18 (×2): 2 via ORAL
  Filled 2016-04-17 (×2): qty 2

## 2016-04-17 MED ORDER — ACETAMINOPHEN 325 MG PO TABS: 650.0000 mg | ORAL_TABLET | ORAL | Status: DC | PRN

## 2016-04-17 MED ORDER — TERBUTALINE SULFATE 1 MG/ML IJ SOLN
0.2500 mg | Freq: Once | INTRAMUSCULAR | Status: DC | PRN
  Filled 2016-04-17: qty 1

## 2016-04-17 MED ORDER — FENTANYL 2.5 MCG/ML BUPIVACAINE 1/10 % EPIDURAL INFUSION (WH - ANES): 14.0000 mL/h | INTRAMUSCULAR | Status: DC | PRN

## 2016-04-17 MED ORDER — OXYTOCIN 40 UNITS IN LACTATED RINGERS INFUSION - SIMPLE MED
2.5000 [IU]/h | INTRAVENOUS | Status: DC
  Administered 2016-04-17: 2.5 [IU]/h via INTRAVENOUS

## 2016-04-17 MED ORDER — IBUPROFEN 600 MG PO TABS
600.0000 mg | ORAL_TABLET | Freq: Four times a day (QID) | ORAL | Status: DC
  Administered 2016-04-17 – 2016-04-19 (×8): 600 mg via ORAL
  Filled 2016-04-17 (×8): qty 1

## 2016-04-17 MED ORDER — LACTATED RINGERS IV SOLN
INTRAVENOUS | Status: DC
  Administered 2016-04-17: 01:00:00 via INTRAVENOUS

## 2016-04-17 MED ORDER — LIDOCAINE HCL (PF) 1 % IJ SOLN
30.0000 mL | INTRAMUSCULAR | Status: AC | PRN
  Administered 2016-04-17: 30 mL via SUBCUTANEOUS
  Filled 2016-04-17: qty 30

## 2016-04-17 NOTE — Progress Notes (Signed)
Michele Wells is a 31 y.o. G2P0010 at 6919w2d by ultrasound admitted for induction of labor due to Post dates. Due date 04/15/16.  Subjective:   Objective: BP (!) 148/84   Pulse 70   Temp 97.8 F (36.6 C) (Oral)   Resp (!) 22   Ht 5\' 6"  (1.676 m)   Wt 116.1 kg (256 lb)   LMP 07/03/2015   BMI 41.32 kg/m  No intake/output data recorded. No intake/output data recorded.  FHT:  FHR: 130 bpm, variability: moderate,  accelerations:  Present,  decelerations:  Absent UC:   irregular, every 2-4 minutes SVE:   Dilation: 5 Effacement (%): 90 Station: -1 Exam by:: K. [place]  Labs: Lab Results  Component Value Date   WBC 9.1 04/17/2016   HGB 11.8 (L) 04/17/2016   HCT 33.4 (L) 04/17/2016   MCV 85.2 04/17/2016   PLT 283 04/17/2016    Assessment / Plan: Induction of labor due to post dates,  progressing well on pitocin  Labor: Progressing on Pitocin, will continue to increase then AROM Preeclampsia:  no signs or symptoms of toxicity Fetal Wellbeing:  Category I Pain Control:  Labor support without medications I/D:  n/a Anticipated MOD:  NSVD  Baird KayKathryn Manchester 04/17/2016, 12:51 PM  Seen and examined by me also  Agree with note Observe for increased labor progress Aviva SignsMarie L Nora Sabey, CNM

## 2016-04-17 NOTE — Lactation Note (Signed)
This note was copied from a baby's chart. Lactation Consultation Note Follow up visit at 5 hours of age.  Mom reports needing help latching baby.  Baby holding baby at breast latching on and off.  Baby noted to have deep dimpling on right cheek.  LC rolled our lower lip and baby would let go of breast repeatedly.  Mom has easily expressed colostrum.  [place] assisted with spoon feeding about 21ms of colostrum.  Baby observed to have heart shaped tongue on extension.  Baby sucks on gloved finger and tongue thrusts.  LC can feel short tight and thin anterior frenulum, not visualized at this visit.  Mom reports FOB has the same thing with his tongue.  LC encouraged mom to wear shells, hand express and try to latch baby with good breast compressions.  Mom may need NS, but baby was fed well with spoon feeding at this time.  Mom has DEBP at bedside with kit brought in by RN.  LC reported to RN to set up DEBP tonight.   [facility] given and discussed.  Encouraged to feed with early cues on demand.  Early newborn behavior discussed.  Mom to call for assist as needed.    Patient Name: Michele JInessa WardropTWKETI'JDate: 04/17/2016     Maternal Data    Feeding    LATCH Score/Interventions Latch: Too sleepy or reluctant, no latch achieved, no sucking elicited. Intervention(s): Skin to skin  Audible Swallowing: None  Type of Nipple: Flat  Comfort (Breast/Nipple): Soft / non-tender     Hold (Positioning): Full assist, staff holds infant at breast  [facility] R[facility] FallScore: 3  Lactation Tools Discussed/Used     Consult Status      Michele Wells, Michele Null2/17/2018, 9:34 PM

## 2016-04-17 NOTE — H&P (Signed)
Michele Wells is a 31 y.o. female G2P0010 presenting for IOL for postdates. Denies leaking or bldg. Rare ctx. Her preg has been followed by the [facility] office and has been essentially unremarkable other than 1) varicella nonimmune  OB History    Gravida Para Term Preterm AB Living   2 0 0 0 1 0   SAB TAB Ectopic Multiple Live Births   1 0 0 0 0     Past Medical History:  Diagnosis Date  . Anxiety    Past Surgical History:  Procedure Laterality Date  . APPENDECTOMY     Family History: family history includes Cancer in her maternal grandfather and maternal grandmother; Diabetes in her maternal grandmother; Heart disease in her paternal grandfather. Social History:  reports that she has quit smoking. She has never used smokeless tobacco. She reports that she does not drink alcohol or use drugs.     Maternal Diabetes: No Genetic Screening: Normal Maternal Ultrasounds/Referrals: Normal Fetal Ultrasounds or other Referrals:  None Maternal Substance Abuse:  No Significant Maternal Medications:  None Significant Maternal Lab Results:  Lab values include: Group B Strep negative Other Comments:  None  ROS History   Last menstrual period 07/03/2015. Exam Physical Exam  Constitutional: She is oriented to person, place, and time. She appears well-developed.  HENT:  Head: Normocephalic.  Neck: Normal range of motion.  Cardiovascular: Normal rate.   Respiratory: Effort normal.  Musculoskeletal: Normal range of motion.  Neurological: She is alert and oriented to person, place, and time.  Skin: Skin is warm and dry.  Psychiatric: She has a normal mood and affect. Her behavior is normal. Thought content normal.    Prenatal labs: ABO, Rh: O/Positive/-- (09/06 1632) Antibody: Negative (09/06 1632) Rubella: 11.40 (09/06 1632) RPR: Non Reactive (11/20 1030)  HBsAg: Negative (09/06 1632)  HIV: Non Reactive (11/20 1030)  GBS: Negative (01/04 1544)    Assessment/Plan: IUP@41 .2wks Cx unfavorable GBS neg  Admit to [facility] cytotec for cx ripening, then progress to FB/Pit Anticipate SVD  Michele Wells CNM 04/17/2016, 12:32 AM

## 2016-04-17 NOTE — Progress Notes (Signed)
Patient ID: Michele Wells, female   DOB: 09/21/1985, 31 y.o.   MRN: [id number] Rec'd cytotec x 1 dose- feeling a little crampy  VSS, afeb FHR 120s, +accels, no decels Irreg ctx Cx 1/90/-2, mid position, soft  IUP@term  Unfavorable cx  Cervical foley placed as well as a second cytotec Plan Pit when it falls out.  Cam HaiSHAW, KIMBERLY CNM 04/17/2016. 6:41 AM

## 2016-04-17 NOTE — Anesthesia Pain Management Evaluation Note (Signed)
  CRNA Pain Management Visit Note  Patient: Michele Wells, 31 y.o., female  "Hello I am a member of the anesthesia team at [facility]. We have an anesthesia team available at all times to provide care throughout the hospital, including epidural management and anesthesia for C-section. I don't know your plan for the delivery whether it a natural birth, water birth, IV sedation, nitrous supplementation, doula or epidural, but we want to meet your pain goals."   1.Was your pain managed to your expectations on prior hospitalizations?   No prior hospitalizations  2.What is your expectation for pain management during this hospitalization?     Nitrous Oxide  3.How can we help you reach that goal? unsure  Record the patient's initial score and the patient's pain goal.   Pain: 3  Pain Goal: 10 [facility] wants you to be able to say your pain was always managed very well.  Cephus ShellingBURGER,[name] 04/17/2016

## 2016-04-18 NOTE — Clinical Social Work Maternal (Signed)
CLINICAL SOCIAL WORK MATERNAL/CHILD NOTE  Patient Details  Name: Magdalina L Helsley MRN: [id number] Date of Birth: 06/09/1985  Date:  04/18/2016  Clinical Social Worker Initiating Note:   , MSW, LCSW-A   Date/ Time Initiated:  04/18/16/1303              Child's Name:  Angelo Hodges    Legal Guardian:  Other (Comment) (Not established by court system; MOB and FOB ( James Hodges Jr. ) parent collectively in primary household composition)   Need for Interpreter:  None   Date of Referral:  04/17/16     Reason for Referral:  Other (Comment) (MOB hx of anxiety )   Referral Source:  [facility]   Address:  [address] [place] [address]  Phone number:  [phone]   Household Members: Self, Significant Other   Natural Supports (not living in the home): Parent, Immediate Family, Extended Family, Friends   Professional Supports:None   Employment:    Type of Work:     Education:      Financial Resources:Medicaid   Other Resources: WIC, Food Stamps    Cultural/Religious Considerations Which May Impact Care: None reported.   Strengths: Ability to meet basic needs , Pediatrician chosen , Compliance with medical plan , Home prepared for child    Risk Factors/Current Problems: Other (Comment) (Hx of anxiety )   Cognitive State: Alert , Able to Concentrate , Insightful , Goal Oriented    Mood/Affect: Calm , Comfortable , Interested    CSW Assessment:CSW met with MOB at bedside to complete assessment for consult regarding anxiety. Upon this writers arrival, MOB was accompanied by FOB and babys maternal grandmother. With MOB's permission, this writer explained role and reasoning for visit. At this time, MOB notes she has struggled with anxiety/panic attacks for some time now; however, is currently not on meds and has done well with self-coping. MOB noted her MD is watching closely to ensure she does not need to  re-start anxiety medicine since delivering baby. This writer praised MOB for being aware of cognitive state and for managing with self-taught coping skills. MOB was thankful. This writer observed MOB receiving good support from FOB and her mother regarding baby and her own being. MOB reports she has no further needs at this time. CSW discussed PPD and safe sleeping/SIDS. MOB and FOB verbalize understanding of both. CSW see's no barriers to d/c; thus, case closed to this CSW.   CSW Plan/Description: No Further Intervention Required/No Barriers to Discharge, Patient/Family Education      , MSW, LCSW-A Clinical Social Worker  [facility]  Office: [phone]    CLINICAL SOCIAL WORK MATERNAL/CHILD NOTE  Patient Details  Name: MARYE EAGEN MRN: [id number] Date of Birth: Jul 06, 1985  Date:  04/18/2016  Clinical Social Worker Initiating Note:  Ferdinand Lango Kewanda Poland, MSW, LCSW-A  Date/ Time Initiated:  04/18/16/1303     Child's Name:  Rockney Ghee    Legal Guardian:  Other (Comment) (Not established by court system; MOB and FOB Dorian Heckle. ) parent collectively in primary household composition)   Need for Interpreter:  None   Date of Referral:  04/17/16     Reason for Referral:  Other (Comment) (MOB hx of anxiety )   Referral Source:  [facility]   Address:  [place] [address]  Phone number:  [phone]   Household Members:  Self, Significant Other   Natural Supports (not living in the home):  Parent, Immediate Family, Extended Family, Friends   Professional Supports: None   Employment:     Type of Work:     Education:      Pensions consultant:  [employer]   Other Resources:  [employer], Physicist, medical    Cultural/Religious Considerations Which May Impact Care:  None reported.   Strengths:  Ability to meet basic needs , Pediatrician chosen , Compliance with medical plan , Home prepared for child    Risk Factors/Current Problems:  Other (Comment) (Hx of anxiety )   Cognitive State:  Alert , Able to Concentrate , Insightful , Goal Oriented    Mood/Affect:  Calm , Comfortable , Interested    CSW Assessment: CSW met with MOB at bedside to complete assessment for consult regarding anxiety. Upon this writers arrival, MOB was accompanied by FOB and babys maternal grandmother. With MOB's permission, this writer explained role and reasoning for visit. At this time, MOB notes she has struggled with anxiety/panic attacks for some time now; however, is currently not on meds and has done well with self-coping. MOB noted her MD is watching closely to ensure she does not need to re-start anxiety medicine since  delivering baby. This Probation officer praised MOB for being aware of cognitive state and for managing with self-taught coping skills. MOB was thankful. This Probation officer observed MOB receiving good support from FOB and her mother regarding baby and her own being. MOB reports she has no further needs at this time. CSW discussed PPD and safe sleeping/SIDS. MOB and FOB verbalize understanding of both. CSW see's no barriers to d/c; thus, case closed to this CSW.   CSW Plan/Description:  No Further Intervention Required/No Barriers to Discharge, Patient/Family Education     Oda Cogan, MSW, [facility]  Office: [phone]

## 2016-04-18 NOTE — Lactation Note (Signed)
This note was copied from a baby's chart. Lactation Consultation Note  Patient Name: Michele Brayton CavesJessica Waterworth ZOXWR'UToday's Date: 04/18/2016 Reason for consult: Follow-up assessment;Difficult latch RN called for LC to assist with latch. Mom getting very anxious about baby not latching and Mom not receiving but few drops with pumping/hand expression. Mom reports she has pumped 4-5 times since last night.  Basic teaching reviewed with Mom and normal newborn behaviors for 1st 24 hours. Mom feels baby fussy not that getting close to 24 hours and not latching.  Attempted to latch baby using breast compression but baby unable to sustain latch. LC does note baby has short, lingual frenulum, tongue thrusting. Initiated 20 nipple shield and after few attempts baby was able to sustain latch, demonstrated some good suckling bursts, moisture noted in nipple shield and on Mom's nipple when baby came off the breast after 5 minutes. Mom reported pain with the feeding, nipple looked round when baby came off the breast, no trauma observed. Attempted to initiate 24 nipple shield to see if more comfortable for Mom, but baby would not latch. Not able to get enough colostrum with hand expression to pre-fill nipple shield, Mom wanting baby to have formula, demonstrated how to pre-load nipple shield using curved tipped syringe. Baby too sleepy to latch. Placed baby STS on Mom and left LC phone number for Mom to call with next feeding. Supplemental guidelines reviewed with and given to Mom. Discussed if baby continue to struggle with latch to consider supplementing at breast using 5 fr feeding tube/syringe. Mom to call LC with next feeding.  Maternal Data    Feeding Feeding Type: Breast Fed Length of feed: 5 min  LATCH Score/Interventions Latch: Repeated attempts needed to sustain latch, nipple held in mouth throughout feeding, stimulation needed to elicit sucking reflex. (using 20 nipple shield) Intervention(s): Adjust  position;Assist with latch;Breast massage;Breast compression  Audible Swallowing: None  Type of Nipple: Everted at rest and after stimulation (short nipple shafts bilateral, compressible)  Comfort (Breast/Nipple): Soft / non-tender     Hold (Positioning): Assistance needed to correctly position infant at breast and maintain latch. Intervention(s): Breastfeeding basics reviewed;Support Pillows;Position options;Skin to skin  LATCH Score: 6  Lactation Tools Discussed/Used Tools: Nipple Shields;Pump;Shells Nipple shield size: 20 Shell Type: Inverted Breast pump type: Double-Electric Breast Pump   Consult Status Consult Status: Follow-up Date: 04/18/16 Follow-up type: In-patient    Alfred LevinsGranger, Merrit Waugh Ann 04/18/2016, 4:01 PM

## 2016-04-18 NOTE — Lactation Note (Signed)
This note was copied from a baby's chart. Lactation Consultation Note Follow up visit at 28 hours of age.  Baby has had poor feedings, appear jaundice with screening level WNL and now fussy.  Baby has had 3 stools and 1 void with few mls hand expressed and spoon fed to baby during early hours of life.  Baby has not had a measurable feeding in many hours.  Mom reports baby will latch for a few minutes on and off.  MOm has NS, but most recent attempt she did not use NS. Mom has shells that appear to be helping today.   LC assisted with latching baby in football hold on right breast.  LC reviewed application of NS with mom for proper fit.  Baby with latch, but only holds breast.  Baby noted to hold chin close to chest with little extension.  Baby is very sleepy and not eager to eat.  Baby will suck on gloved finger.  LC hand expressed many drops from breast and placed on gloved finger to baby's mouth.  Mom reports pumping, but not able to express at this time. LC re attempted latch without NS.  Baby will suck a few times and then just hold breast.  Baby appears to have wide gape with flanged lips. LC impression is baby is sleepy due to poor feedings and tired out at attempts to latch with poor function of tongue.  LC weighed baby with noted 5.5% weight loss in 1st 28 hours of life. LC discussed supplementing baby with alimentum, mom agreeable.  LC assisted with finger feeding with glove and syringe.  Baby did not suck well with tongue thrusting and poor coordination. LC instructed mom on feedings and to call for assist as needed.  Baby remains sleepy and not perking up at this time. Mom burping baby STS.   Mom to wake baby as needed for feedings 2 1/2-3 hours for 8-12 feedings/ 24 hours.  Mom to attempt latching and supplement with EBM or formula up to guidelines per hours of age on written sheet in room.  LC encouraged mom to post pump 8x/24 hours.   Mom denies further questions at this time.  B=      Patient Name: Michele Wells UJWJX'BToday's Date: 04/18/2016 Reason for consult: Follow-up assessment;Difficult latch;Infant weight loss   Maternal Data Has patient been taught Hand Expression?: Yes  Feeding Feeding Type: Breast Fed Length of feed:  (few sucks with and without NS.)  LATCH Score/Interventions Latch: Repeated attempts needed to sustain latch, nipple held in mouth throughout feeding, stimulation needed to elicit sucking reflex. Intervention(s): Skin to skin;Teach feeding cues;Waking techniques Intervention(s): Breast massage;Breast compression  Audible Swallowing: None  Type of Nipple: Flat  Comfort (Breast/Nipple): Soft / non-tender     Hold (Positioning): Assistance needed to correctly position infant at breast and maintain latch. Intervention(s): Breastfeeding basics reviewed;Support Pillows;Position options;Skin to skin  LATCH Score: 5  Lactation Tools Discussed/Used Tools: Nipple Shields Nipple shield size: 24 Pump Review: Setup, frequency, and cleaning;Milk Storage Initiated by:: JS reviewed  Date initiated:: 04/18/16   Consult Status Consult Status: Follow-up Date: 04/19/16 Follow-up type: In-patient    Beverely RisenShoptaw, Arvella MerlesJana Lynn 04/18/2016, 9:47 PM

## 2016-04-18 NOTE — Progress Notes (Signed)
Post Partum Day 1 Subjective: no complaints, up ad lib, voiding and tolerating PO  Working on breastfeeding  Objective: Blood pressure 113/65, pulse (!) 101, temperature 98 F (36.7 C), temperature source Oral, resp. rate 20, height 5\' 6"  (1.676 m), weight 116.1 kg (256 lb), last menstrual period 07/03/2015, SpO2 100 %, unknown if currently breastfeeding.  Physical Exam:  General: alert Lochia: appropriate Uterine Fundus: firm and NT at U-2 DVT Evaluation: No evidence of DVT seen on physical exam.   Recent Labs  04/17/16 0100  HGB 11.8*  HCT 33.4*    Assessment/Plan: Plan for discharge tomorrow   LOS: 1 day   Sanii Kukla C Celedonio Sortino 04/18/2016, 8:55 AM

## 2016-04-19 MED ORDER — IBUPROFEN 600 MG PO TABS: 600.0000 mg | ORAL_TABLET | Freq: Four times a day (QID) | ORAL | 0 refills | Status: DC

## 2016-04-19 NOTE — Discharge Summary (Signed)
OB Discharge Summary  Patient Name: Michele Wells DOB: 1985/04/28 MRN: [id number]  Date of admission: 04/17/2016 Delivering MD: Aviva Signs   Date of discharge: 04/19/2016  Admitting diagnosis: INDUCTION Intrauterine pregnancy: [redacted]w[redacted]d     Secondary diagnosis:Active Problems:   Post term pregnancy at [redacted] weeks gestation  Additional problems:none     Discharge diagnosis: Term Pregnancy Delivered                                                                     Post partum procedures:n/a  Augmentation: Pitocin  Complications: None  Hospital course:  Induction of Labor With Vaginal Delivery   31 y.o. yo G2P1011 at [redacted]w[redacted]d was admitted to the hospital 04/17/2016 for induction of labor.  Indication for induction: Postdates.  Patient had an uncomplicated labor course as follows: Membrane Rupture Time/Date: 12:15 PM ,04/17/2016   Intrapartum Procedures: Episiotomy: None [1]                                         Lacerations:  Labial [10];2nd degree [3]  Patient had delivery of a Viable infant.  Information for the patient's newborn:  Tyshell, Ramberg [[id number]]  Delivery Method: Vag-Spont   04/17/2016  Details of delivery can be found in separate delivery note.  Patient had a routine postpartum course. Patient is discharged home 04/19/16.  Physical exam  Vitals:   04/17/16 2243 04/18/16 0511 04/18/16 1900 04/19/16 0528  BP: 115/60 113/65 112/67 116/68  Pulse: 98 (!) 101 90 78  Resp: 16 20 18 16   Temp: 98.8 F (37.1 C) 98 F (36.7 C) 98.4 F (36.9 C) 98 F (36.7 C)  TempSrc: Oral Oral Oral Oral  SpO2:   99%   Weight:      Height:       General: alert, cooperative and no distress Lochia: appropriate Uterine Fundus: firm Incision: N/A DVT Evaluation: No evidence of DVT seen on physical exam. Labs: Lab Results  Component Value Date   WBC 9.1 04/17/2016   HGB 11.8 (L) 04/17/2016   HCT 33.4 (L) 04/17/2016   MCV 85.2 04/17/2016   PLT 283 04/17/2016    CMP Latest Ref Rng & Units 04/17/2016  Glucose 65 - 99 mg/dL 086(V)  BUN 6 - 20 mg/dL 12  Creatinine 7.84 - 6.96 mg/dL 2.95  Sodium 284 - 132 mmol/L 134(L)  Potassium 3.5 - 5.1 mmol/L 3.8  Chloride 101 - 111 mmol/L 104  CO2 22 - 32 mmol/L 20(L)  Calcium 8.9 - 10.3 mg/dL 4.4(W)  Total Protein 6.5 - 8.1 g/dL 7.2  Total Bilirubin 0.3 - 1.2 mg/dL 0.9  Alkaline Phos 38 - 126 U/L 115  AST 15 - 41 U/L 27  ALT 14 - 54 U/L 19    Discharge instruction: per After Visit Summary and "Baby and Me Booklet".  After Visit Meds:  Allergies as of 04/19/2016   No Known Allergies     Medication List    TAKE these medications   doxylamine (Sleep) 25 MG tablet Commonly known as:  UNISOM Take 25 mg by mouth at bedtime as needed.   ibuprofen  600 MG tablet Commonly known as:  ADVIL,MOTRIN Take 1 tablet (600 mg total) by mouth every 6 (six) hours.   VITAFOL GUMMIES PO Take 2 capsules by mouth.       Diet: routine diet  Activity: Advance as tolerated. Pelvic rest for 6 weeks.   Outpatient follow up:6 weeks Follow up Appt:No future appointments. Follow up visit: No Follow-up on file.  Postpartum contraception: Progesterone only pills  Newborn Data: Live born female  Birth Weight: 8 lb 4.5 oz (3756 g) APGAR: 9, 9  Baby Feeding: Breast Disposition:home with mother   04/19/2016 Wyvonnia DuskyMarie Blair Mesina, CNM

## 2016-04-19 NOTE — Progress Notes (Signed)
Discharge education complete. Discharge instructions and follow up appointment discussed. Patient verbalized understanding. 

## 2016-04-19 NOTE — Lactation Note (Signed)
This note was copied from a baby's chart. Lactation Consultation Note  Patient Name: Michele Wells ZOXWR'UToday's Date: 04/19/2016 Reason for consult: Follow-up assessment    With this mom of a term baby, now 6443 hours old. The baby has not been able to latch, and does have an anterior, short frenulum. I discussed this with the parents, and they have spoken to Dr. Margo AyeHall about having her clip the frenulum. Dr. Margo AyeHall wants to be sure the mom wants to breast feed, before doing the procedure. I relayed this information back to the parents, and told mom to decide what she wanted to do, and let her nurse know to inform Dr. Margo AyeHall, if needed.  Mom's milk is transitioning in, and she is expressing about 10 ml's now. I reviewed hand expression with mom.  Mom given information on o/p lactation consults, but she did not make an appointment at this time. The baby will be reweighed at 12 noon, and I assume a decision will be made as to whether the baby is being discharged today, or not.   Maternal Data    Feeding Feeding Type: Breast Milk with Formula added Nipple Type: Slow - flow  LATCH Score/Interventions                      Lactation Tools Discussed/Used WIC Program: (S) Yes (WIC fax asent for mom to obtain a DEP)   Consult Status Consult Status: Follow-up Date: 04/20/16 Follow-up type: In-patient    [name], [name] 04/19/2016, 11:54 AM

## 2016-04-19 NOTE — Lactation Note (Signed)
This note was copied from a baby's chart. Lactation Consultation Note  Patient Name: Michele Brayton CavesJessica Dalporto VWUJW'JToday's Date: 04/19/2016 Reason for consult: Follow-up assessment with this mom of a term baby, ow had a lingual frenectomy done by Dr. Margo AyeHall today. I assisted mom with latching the baby after this procedure. He was fussy at first, with EBm fed with curved tip syring, during the latch, he suckled fairly deeply for about 10 minutes. Mom reports latch now comfortable, and her nipple was not pinched at all with latch. [facility] and I decreased mom to 21 flanges, with amore comfortable fit. Mom advised to add coconut oil with pumping. Mom will use hand pump overnight.   I advised parents to do some suck training with their finger with the baby, massage his gums, and try to get him to extend his tongue by gently tapping on his upper lip and chin.  Mom has an o/p consult on Thursday, 2/22 at 1 pm, and referral was done by Dr. Margo AyeHall.    Maternal Data    Feeding Feeding Type: Breast Fed Nipple Type: Slow - flow Length of feed: 10 min  LATCH Score/Interventions Latch: Repeated attempts needed to sustain latch, nipple held in mouth throughout feeding, stimulation needed to elicit sucking reflex. Intervention(s): Teach feeding cues;Waking techniques Intervention(s): Adjust position;Assist with latch  Audible Swallowing: A few with stimulation Intervention(s): Hand expression  Type of Nipple: Flat (mom pre pumped and can evert her nipples for latching)  Comfort (Breast/Nipple): Soft / non-tender     Hold (Positioning): Assistance needed to correctly position infant at breast and maintain latch. Intervention(s): Breastfeeding basics reviewed;Support Pillows;Position options  LATCH Score: 6  Lactation Tools Discussed/Used Tools: Other (comment);Flanges (cured tip syringe at the breast, mom decreased to 21 flanges with increased comfort, to be given coconut oil to  use with pumping)   Consult Status Consult Status: Complete Date: 04/22/16 Follow-up type: Out-patient    [name], Keaundra Stehle Anne 04/19/2016, 4:31 PM

## 2016-04-19 NOTE — Plan of Care (Signed)
Problem: Nutritional: Goal: Mothers verbalization of comfort with breastfeeding process will improve Patient pumping and giving EBM and formula. Working with pediatrician for plan of possibly clipping baby's frenulum due to tongue tie. Mother working with lactation and has a plan for pumping and follow up.

## 2016-04-19 NOTE — Lactation Note (Signed)
This note was copied from a baby's chart. Lactation Consultation Note Baby had 6% weight loss. Poor BF. Mom supplementing w/curve tip syring minimal amount according to supplement feeding sheet.  Mom holding baby. Baby hadn't eaten since 0130. Encouraged to feed baby. Offered assistance.  Changed wet diaper. Noted baby sweating. Baby had sleeper on, baby blanket, laying on mom, and then mom's cover. Discussed baby being to hot and sweating.  Encouraged football hold. Mom has short shaft non compressible breast tissue. Edema present to areola and nipple. Mom has generalized edema.  Encouraged to wear shells in bra. Mom stated she wore them during the day, but didn't during the night d/t holding the baby.  Mom has DEBP. Mom collected 2ml colostrum. Gave to baby, then supplemented w/Alimentum w/curve tip syring.  Assisted in latching baby. Baby unable to latch d/t short shaft. Baby frustrated. Encouraged to use NS. Mom stated she didn't like NS. Offered  To allow LC to assist w/NS. Mom agreed. #20 NS fitted well. Latched baby in football position. Flanged baby's lips. Mom denied pain at first, then stated it was hurting. Adjusted flange which looked good. Mom stated she didn't like it. Broke latch. Discussed options of possible pumping and bottle feeding. Mom in agreement.  Gave baby slow flow nipple w/Similac. Encouraged to give colostrum first then formula. [facility]. Discussed w/mom if her edema around her nipples decrease, wore her shells, and pumped to evert nipples more for deep latch, mom hopefull can BF again, but until the nipple is everted more for a deep latch and baby is able to get transfer from breast, mom will have to pump and bottle feed. Reported to RN. Patient Name: Boy Brayton CavesJessica Delay ZOXWR'UToday's Date: 04/19/2016 Reason for consult: Follow-up assessment;Infant weight loss   Maternal Data Does the patient have breastfeeding experience prior to this delivery?:  No  Feeding Feeding Type: Formula Nipple Type: Slow - flow Length of feed: 0 min  LATCH Score/Interventions Latch: Repeated attempts needed to sustain latch, nipple held in mouth throughout feeding, stimulation needed to elicit sucking reflex. Intervention(s): Teach feeding cues;Waking techniques Intervention(s): Adjust position;Assist with latch;Breast massage;Breast compression  Audible Swallowing: None Intervention(s): Hand expression  Type of Nipple: Flat Intervention(s): Double electric pump;Shells;Reverse pressure  Comfort (Breast/Nipple): Soft / non-tender     Hold (Positioning): Full assist, staff holds infant at breast Intervention(s): Support Pillows;Breastfeeding basics reviewed;Position options;Skin to skin  LATCH Score: 4  Lactation Tools Discussed/Used Tools: Shells;Nipple Dorris CarnesShields;Pump Nipple shield size: 20 Shell Type: Inverted Breast pump type: Double-Electric Breast Pump   Consult Status Consult Status: Follow-up Date: 04/20/16 Follow-up type: In-patient    Charyl DancerCARVER, Oveta Idris G 04/19/2016, 5:18 AM

## 2016-04-20 ENCOUNTER — Telehealth (HOSPITAL_COMMUNITY): Payer: Self-pay | Admitting: Lactation Services

## 2016-04-20 NOTE — Telephone Encounter (Signed)
Was D/C 04/19/16 with [facility]. This am she noticed there are multiple clear little balls on the tip of the L nipple. The R breast near the arm pit is hard, sore and she does not think it is draining with the pump. The baby latched last night to the R breast for about 5 minutes. Since she has been pumping every 3-4 hr around the clock. She is now using a DEBP. She has the pump set on 6 drops. [facility]. She is using the 21 flanges. She has an OP apt with Lactation on 04/22/16.  Suggested moist heat and hands on pumping on the R breast. Move up the 24 flange on the L side. Turn the pump down to 4 drops. Reviewed the symptoms of engorgement and mastitis. She will call her MD if conditions gets worse.

## 2016-04-22 ENCOUNTER — Ambulatory Visit: Payer: Self-pay

## 2016-04-22 NOTE — Lactation Note (Addendum)
This note was copied from a baby's chart. Lactation Consult: mom here for assist with latch. She has been mostly bottle feeding formula and some EBM. Mom only pumping 2-3 times/day. Michele Wells would not latch to bare breast. Mom easily able to hand express whitish milk.  Suggested using NS since he has been getting bottle nipples and pacifier. Michele Wells latched well after a couple of attempts.and nursed for 15 min on each breast. Dad concerned whenever baby gets fussy and wants to give pacifier. Encouraged to feed instead. Mom pleased he had latched and nursed with no pain.  Encouraged to massage breasts and hand expression before latching,  Trying to latch baby at every feeding, try first without NS then with if he won't latch Pump if he does not latch and they give formula.  Reviewed supply and demand and encouraged emptying breasts q 2-3 hours Reviewed he may be hungry again soon since he did not take as much as usual and he will still need supplementing until milk supply increases. .To watch for feeding cues.  Offered another OP appointment but mom states she feels better about feedings and will call if needed.  Reviewed our phone number, and [facility] as resources for support. No further questions at present. To call prn   Mother's reason for visit:  Help with breast feeding Visit Type:  Feeding assist Appointment Notes:  Baby had ant tongue tie clipped by Dr Margo AyeHall  Consult:  Initial Lactation Consultant:  Pamelia HoitWeeks, Trygve Thal D  ________________________________________________________________________ Baby's Name:  Michele Wells Date of Birth:  04/17/2015 [facility] Gender:  female Gestational Age: 9242w2d (At Birth) Birth Weight:  8 lb 4.5 oz (3756 g)   ________________________________________________________________________  Mother's Name: Michele Wells Type of delivery:  Vaginal, Spontaneous Delivery Breastfeeding Experience:   P1  ________________________________________________________________________  Breastfeeding History (Post Discharge)  Frequency of breastfeeding:  Mostly bottle feeding formula with some EBM  Supplementation  Formula:  Volume 60 ml Frequency:  q 2-3 hours        Brand: Similac  Breastmilk:  As avialable Method:  Bottle,   Pumping  Type of pump:  Symphony Frequency:  2-3 times/day Volume:  2-4 oz  Infant Intake and Output Assessment  Voids:  6 in 24 hrs.  Color:  Clear yellow Stools:  3 in 24 hrs.  Color:  Brown and Yellow  ________________________________________________________________________  Maternal Breast Assessment  Breast:  Filling Nipple:  Erect  _______________________________________________________________________ Feeding Assessment/Evaluation  Initial feeding assessment:  Infant's oral assessment:  Variance- had ant tongue tie clipped by Dr Margo AyeHall on Monday  Positioning:  Cross cradle Right breast  LATCH documentation:  Latch:  2 = Grasps breast easily, tongue down, lips flanged, rhythmical sucking. with NS  Audible swallowing:  2 = Spontaneous and intermittent  Type of nipple:  2 = Everted at rest and after stimulation  Comfort (Breast/Nipple):  1 = Filling, red/small blisters or bruises, mild/mod discomfort  Hold (Positioning):  1 = Assistance needed to correctly position infant at breast and maintain latch  LATCH score:  8  Attached assessment:  Deep  Lips flanged:  Yes.    Lips untucked:  No.  Suck assessment:  Nutritive  Tools:  Nipple shield 24 mm Instructed on use and cleaning of tool:  Yes.  Mom correctly applied NS  Pre-feed weight:  3776 g 8 # 5.2 oz Post-feed weight:  3790 g   8 # 5.7 oz Amount transferred:  14 ml  Michele Wells  would not latch to bare breast. Mom's nipples are erect but short. Mom has not pumped since 8 am, her breasts are full but not engorged. Mom tried NS in hospital but reports it was pinching with it. Suggested  trying it again and mom agreeable. Used #24 NS. After a couple of attempts, Michele Candle latched well and nursed for 15 min. Needed some stimulation to continue nursing. Mom reports no pain with this latch.     Pre-feed weight:  3790  g  8 # 5.7 oz Post-feed weight:  3812 g  8 # 6.5 oz Amount transferred:  22 ml Amount supplemented:  2 ml he would not take any more at this time.   Total amount pumped post feed:  Did not pump since he nursed on both breasts and mom reports they feel softer.   Total amount transferred:   36 ml Total supplement given:  2 ml

## 2016-05-13 ENCOUNTER — Ambulatory Visit: Payer: Medicaid Other | Admitting: Obstetrics and Gynecology

## 2016-05-20 ENCOUNTER — Encounter: Payer: Self-pay | Admitting: Obstetrics & Gynecology

## 2016-05-20 ENCOUNTER — Ambulatory Visit (INDEPENDENT_AMBULATORY_CARE_PROVIDER_SITE_OTHER): Payer: Medicaid Other | Admitting: Obstetrics & Gynecology

## 2016-05-20 NOTE — Progress Notes (Signed)
Patient is in the office for pp follow up, vaginal delivery 04-17-16, breast and bottle feeding. Patient states bleeding has stopped, no complaints. PP depression score =0

## 2016-05-20 NOTE — Progress Notes (Signed)
Subjective:     Michele Wells is a 31 y.o. female who presents for a postpartum visit. She is 5 weeks postpartum following a spontaneous vaginal delivery. I have fully reviewed the prenatal and intrapartum course. The delivery was at 41.2 gestational weeks. Outcome: spontaneous vaginal delivery. Anesthesia: none. Postpartum course has been good. Baby's course has been normal. Baby is feeding by both breast and bottle -  . Bleeding no bleeding. Bowel function is normal. Bladder function is normal. Patient is not sexually active. Contraception method is condoms. Postpartum depression screening: negative.  The following portions of the patient's history were reviewed and updated as appropriate: allergies, current medications, past family history, past medical history, past social history, past surgical history and problem list.  Review of Systems Pertinent items are noted in HPI.   Objective:    BP 119/77   Pulse 73   Wt 236 lb 3.2 oz (107.1 kg)   Breastfeeding? Yes   BMI 38.12 kg/m   General:  alert, cooperative and no distress            Abdomen: soft, non-tender; bowel sounds normal; no masses,  no organomegaly   Vulva:  not evaluated  Vagina: not evaluated                    Assessment:     normal postpartum exam. Pap smear not done at today's visit.   Plan:    1. Contraception: condoms 2. Breast and bottle 3. Follow up as needed.    Adam PhenixJames G Aanshi Batchelder, MD 05/20/2016

## 2017-01-26 ENCOUNTER — Encounter: Payer: Self-pay | Admitting: Obstetrics and Gynecology

## 2017-01-26 ENCOUNTER — Ambulatory Visit (INDEPENDENT_AMBULATORY_CARE_PROVIDER_SITE_OTHER): Payer: Medicaid Other | Admitting: Obstetrics and Gynecology

## 2017-01-26 ENCOUNTER — Other Ambulatory Visit (HOSPITAL_COMMUNITY)
Admission: RE | Admit: 2017-01-26 | Discharge: 2017-01-26 | Disposition: A | Discharge status: Home / Self Care | Payer: Medicaid Other | Source: Ambulatory Visit | Attending: Obstetrics and Gynecology | Admitting: Obstetrics and Gynecology

## 2017-01-26 VITALS — BP 111/74 | HR 74 | Ht 66.0 in | Wt 266.0 lb

## 2017-01-26 DIAGNOSIS — Z3009 Encounter for other general counseling and advice on contraception: Secondary | ICD-10-CM

## 2017-01-26 DIAGNOSIS — Z0000 Encounter for general adult medical examination without abnormal findings: Secondary | ICD-10-CM | POA: Diagnosis present

## 2017-01-26 DIAGNOSIS — Z01419 Encounter for gynecological examination (general) (routine) without abnormal findings: Secondary | ICD-10-CM | POA: Diagnosis not present

## 2017-01-26 DIAGNOSIS — F53 Postpartum depression: Secondary | ICD-10-CM

## 2017-01-26 DIAGNOSIS — O99345 Other mental disorders complicating the puerperium: Secondary | ICD-10-CM

## 2017-01-26 DIAGNOSIS — N898 Other specified noninflammatory disorders of vagina: Secondary | ICD-10-CM

## 2017-01-26 NOTE — Progress Notes (Signed)
Pt presents for AEX c/o 'postpartum' depression infant is 9 mos. EPDS = 21 / PHQ-9 = 23

## 2017-01-26 NOTE — Progress Notes (Signed)
GYNECOLOGY ANNUAL PREVENTATIVE CARE ENCOUNTER NOTE  Subjective:   Michele Wells is a 31 y.o. [id number] female here for a annual gynecologic exam. Current complaints: significant post partum depression.  Denies abnormal vaginal bleeding, discharge, pelvic pain, or other gynecologic concerns. Does report some dryness with intercourse which causes some pain. Having regular periods, using condoms for birth control, happy with this method and does not want to change.   She is s/p delivery about 9 months ago and here because she has started having worsening depression. She reports feeling very overwhelmed and sad all the time. She worries about whether she is a good mom and has difficulty concentrating on simple tasks. She also reports getting very angry with minor things and feels she is not herself. H/o depression with suicide attempt (overdose on Tylenol/motrin). She reports she did decide to try to commit suicide recently and was writing notes to fiance and son, but stopped herself and decided she needed to get help. She firmly denies any current thoughts of attempting to harm herself. She denies any thoughts of harming fiance or her son. She is here because she did not know where else to start to get help for her depression.   Gynecologic History Patient's last menstrual period was 01/19/2017. Contraception: condoms Last Pap: 2016. Results were: normal, reports h/o abnormal paps Last mammogram: n/a  Obstetric History OB History  Gravida Para Term Preterm AB Living  2 1 1  0 1 1  SAB TAB Ectopic Multiple Live Births  1 0 0 0 1    # Outcome Date GA Lbr Len/2nd Weight Sex Delivery Anes PTL Lv  2 Term 04/17/16 2948w2d 03:19 / 00:23 8 lb 4.5 oz (3.756 kg) M Vag-Spont None  LIV  1 SAB               Past Medical History:  Diagnosis Date  . Anxiety     Past Surgical History:  Procedure Laterality Date  . APPENDECTOMY      Current Outpatient Medications on File Prior to Visit    Medication Sig Dispense Refill  . Ascorbic Acid (VITAMIN C) 100 MG tablet Take 100 mg by mouth daily.    . Cyanocobalamin (B-12 PO) Take by mouth.    . Prenatal Vit-Fe Phos-FA-Omega (VITAFOL GUMMIES PO) Take 2 capsules by mouth.     . doxylamine, Sleep, (UNISOM) 25 MG tablet Take 25 mg by mouth at bedtime as needed.    Marland Kitchen. ibuprofen (ADVIL,MOTRIN) 600 MG tablet Take 1 tablet (600 mg total) by mouth every 6 (six) hours. (Patient not taking: Reported on 05/20/2016) 30 tablet 0   No current facility-administered medications on file prior to visit.     No Known Allergies  Social History   Socioeconomic History  . Marital status: Single    Spouse name: Not on file  . Number of children: Not on file  . Years of education: Not on file  . Highest education level: Not on file  Social Needs  . Financial resource strain: Not on file  . Food insecurity - worry: Not on file  . Food insecurity - inability: Not on file  . Transportation needs - medical: Not on file  . Transportation needs - non-medical: Not on file  Occupational History  . Not on file  Tobacco Use  . Smoking status: Former Games developermoker  . Smokeless tobacco: Never Used  Substance and Sexual Activity  . Alcohol use: No  . Drug use: No  . Sexual  activity: Yes    Birth control/protection: Abstinence, Condom  Other Topics Concern  . Not on file  Social History Narrative  . Not on file    Family History  Problem Relation Age of Onset  . Diabetes Maternal Grandmother   . Breast cancer Maternal Grandmother   . Heart disease Maternal Grandfather   . Heart disease Paternal Grandfather   . Skin cancer Mother   . Thyroid cancer Paternal Grandmother     The following portions of the patient's history were reviewed and updated as appropriate: allergies, current medications, past family history, past medical history, past social history, past surgical history and problem list.  Review of Systems Pertinent items are noted in HPI.    Objective:  BP 111/74   Pulse 74   Ht 5\' 6"  (1.676 m)   Wt 266 lb (120.7 kg)   LMP 01/19/2017   BMI 42.93 kg/m  CONSTITUTIONAL: Well-developed, well-nourished female in no acute distress.  HENT:  Normocephalic, atraumatic, External right and left ear normal. Oropharynx is clear and moist EYES: Conjunctivae and EOM are normal. Pupils are equal, round, and reactive to light. No scleral icterus.  NECK: Normal range of motion, supple, no masses.  Normal thyroid.  SKIN: Skin is warm and dry. No rash noted. Not diaphoretic. No erythema. No pallor. NEUROLOGIC: Alert and oriented to person, place, and time. Normal reflexes, muscle tone coordination. No cranial nerve deficit noted. PSYCHIATRIC: Normal mood and affect. Normal behavior. Normal judgment and thought content. CARDIOVASCULAR: Normal heart rate noted, regular rhythm RESPIRATORY: Clear to auscultation bilaterally. Effort and breath sounds normal, no problems with respiration noted. BREASTS: Symmetric in size. No masses, skin changes, nipple drainage, or lymphadenopathy. ABDOMEN: Soft, normal bowel sounds, no distention noted.  No tenderness, rebound or guarding.  PELVIC: Normal appearing external genitalia; normal appearing vaginal mucosa and cervix.  No abnormal discharge noted.  Pap smear obtained.  Normal uterine size, no other palpable masses, no uterine or adnexal tenderness. MUSCULOSKELETAL: Normal range of motion. No tenderness.  No cyanosis, clubbing, or edema.  2+ distal pulses.   Assessment and Plan:  1. Annual physical exam - Cytology - PAP - Hepatitis B surface antigen - Hepatitis C antibody - HIV antibody - RPR - Cervicovaginal ancillary only  2. Encounter for counseling regarding contraception satisfied with condoms, reviewed options and she will call if she changes her mind  3. Postpartum depression Had long conversation with patient regarding her depression, reassured her it is common and she is not alone, which  she seemed reassured by. She strongly denies any thoughts of trying to harm herself now although she had some thoughts recently, which is what prompted her to come in. Reviewed that if she has any further thoughts of trying to harm herself, she should present to hospital, which she is agreeable to. Reviewed that we can get appointment with psychiatrist in a few days, which she is agreeable to and states she will be fine to get to. She is also agreeable to speak with LCSW today. She verbalizes plan that she will come to hospital if she has any further thoughts of trying to harm herself, or anyone else.   4. Vaginal dryness Recommended lubrication during intercourse prn   Will follow up results of pap smear/STI screen and manage accordingly.   Routine preventative health maintenance measures emphasized. Please refer to After Visit Summary for other counseling recommendations.    Baldemar LenisK. Meryl Davis, M.D. Attending Obstetrician & Gynecologist, [facility], [place]

## 2017-01-27 LAB — CERVICOVAGINAL ANCILLARY ONLY
Bacterial vaginitis: NEGATIVE
Candida vaginitis: NEGATIVE
Chlamydia: NEGATIVE
Neisseria Gonorrhea: NEGATIVE
Trichomonas: NEGATIVE

## 2017-01-27 LAB — HEPATITIS C ANTIBODY: Hep C Virus Ab: <0.1 s/co ratio (ref 0.0–0.9)

## 2017-01-27 LAB — HIV ANTIBODY (ROUTINE TESTING W REFLEX): HIV Screen 4th Generation wRfx: NONREACTIVE

## 2017-01-27 LAB — HEPATITIS B SURFACE ANTIGEN: Hepatitis B Surface Ag: NEGATIVE

## 2017-01-27 LAB — RPR: RPR: NONREACTIVE

## 2017-01-31 LAB — CYTOLOGY - PAP
Diagnosis: NEGATIVE
HPV (WINDOPATH): NOT DETECTED

## 2018-02-14 ENCOUNTER — Encounter

## 2018-03-01 NOTE — L&D Delivery Note (Signed)
Delivery Note Pt reached complete dilation and pushed somewhat uncontrolled fashion.  At 5:13 PM a viable female was delivered via Vaginal, Spontaneous (Presentation: Right Occiput Anterior).  APGAR: 8, 9; weight pending  .   Placenta status: Spontaneous, Intact.  Cord: 3 vessels with the following complications: None.    Anesthesia: Local Episiotomy: None Lacerations:  Small second degree Suture Repair: 3.0 vicryl rapide Est. Blood Loss (mL):  285ml  Mom to postpartum.  Baby to Couplet care / Skin to Skin.  Logan Bores 02/13/2019, 5:42 PM

## 2018-07-21 ENCOUNTER — Encounter: Payer: Medicaid Other | Admitting: Medical

## 2018-07-26 ENCOUNTER — Encounter: Payer: Medicaid Other | Admitting: Advanced Practice Midwife

## 2018-08-07 ENCOUNTER — Encounter (HOSPITAL_COMMUNITY): Payer: Self-pay

## 2018-08-07 LAB — OB RESULTS CONSOLE GC/CHLAMYDIA
Chlamydia: NEGATIVE
Gonorrhea: NEGATIVE

## 2018-08-07 LAB — OB RESULTS CONSOLE ANTIBODY SCREEN: Antibody Screen: NEGATIVE

## 2018-08-07 LAB — OB RESULTS CONSOLE ABO/RH: RH Type: POSITIVE

## 2018-08-07 LAB — OB RESULTS CONSOLE RPR: RPR: NONREACTIVE

## 2018-08-07 LAB — OB RESULTS CONSOLE RUBELLA ANTIBODY, IGM: Rubella: IMMUNE

## 2018-08-07 LAB — OB RESULTS CONSOLE HIV ANTIBODY (ROUTINE TESTING): HIV: NONREACTIVE

## 2018-08-07 LAB — OB RESULTS CONSOLE HEPATITIS B SURFACE ANTIGEN: Hepatitis B Surface Ag: NEGATIVE

## 2018-08-09 ENCOUNTER — Other Ambulatory Visit: Payer: Self-pay

## 2018-08-10 ENCOUNTER — Other Ambulatory Visit (HOSPITAL_COMMUNITY): Payer: Self-pay | Admitting: Obstetrics and Gynecology

## 2018-08-10 ENCOUNTER — Encounter (HOSPITAL_COMMUNITY): Payer: Self-pay | Admitting: *Deleted

## 2018-08-10 DIAGNOSIS — O285 Abnormal chromosomal and genetic finding on antenatal screening of mother: Secondary | ICD-10-CM

## 2018-08-14 ENCOUNTER — Ambulatory Visit (HOSPITAL_COMMUNITY)
Admission: RE | Admit: 2018-08-14 | Discharge: 2018-08-14 | Disposition: A | Discharge status: Home / Self Care | Payer: Medicaid Other | Source: Ambulatory Visit | Attending: Obstetrics and Gynecology | Admitting: Obstetrics and Gynecology

## 2018-08-14 ENCOUNTER — Encounter (HOSPITAL_COMMUNITY): Payer: Self-pay | Admitting: *Deleted

## 2018-08-14 ENCOUNTER — Ambulatory Visit (HOSPITAL_COMMUNITY): Payer: Self-pay | Admitting: Obstetrics and Gynecology

## 2018-08-14 ENCOUNTER — Other Ambulatory Visit: Payer: Self-pay

## 2018-08-14 ENCOUNTER — Other Ambulatory Visit (HOSPITAL_COMMUNITY): Payer: Self-pay | Admitting: Obstetrics and Gynecology

## 2018-08-14 ENCOUNTER — Ambulatory Visit (HOSPITAL_COMMUNITY): Payer: Medicaid Other

## 2018-08-14 ENCOUNTER — Other Ambulatory Visit: Payer: Medicaid Other

## 2018-08-14 ENCOUNTER — Ambulatory Visit (HOSPITAL_COMMUNITY): Payer: Medicaid Other | Admitting: *Deleted

## 2018-08-14 DIAGNOSIS — O280 Abnormal hematological finding on antenatal screening of mother: Secondary | ICD-10-CM

## 2018-08-14 DIAGNOSIS — O285 Abnormal chromosomal and genetic finding on antenatal screening of mother: Secondary | ICD-10-CM

## 2018-08-14 DIAGNOSIS — O99211 Obesity complicating pregnancy, first trimester: Secondary | ICD-10-CM | POA: Diagnosis not present

## 2018-08-14 DIAGNOSIS — Z363 Encounter for antenatal screening for malformations: Secondary | ICD-10-CM | POA: Diagnosis not present

## 2018-08-14 DIAGNOSIS — Z3A13 13 weeks gestation of pregnancy: Secondary | ICD-10-CM

## 2018-08-14 NOTE — Progress Notes (Signed)
Patient had genetic counseling with Control and instrumentation engineer and will have Mat 21 drawn today.

## 2018-08-15 ENCOUNTER — Encounter (HOSPITAL_COMMUNITY): Payer: Self-pay

## 2018-08-15 ENCOUNTER — Ambulatory Visit (HOSPITAL_COMMUNITY): Payer: Medicaid Other

## 2018-08-15 LAB — MATERNIT 21 PLUS CORE, BLOOD

## 2018-08-18 ENCOUNTER — Ambulatory Visit (HOSPITAL_COMMUNITY): Payer: Medicaid Other

## 2018-08-18 ENCOUNTER — Encounter (HOSPITAL_COMMUNITY): Payer: Self-pay

## 2018-08-18 LAB — MATERNIT 21 PLUS CORE, BLOOD
Fetal Fraction: 6
Result (T21): NEGATIVE
Trisomy 13 (Patau syndrome): NEGATIVE
Trisomy 18 (Edwards syndrome): NEGATIVE
Trisomy 21 (Down syndrome): NEGATIVE

## 2018-08-23 IMAGING — US US MFM OB DETAIL+14 WK
1 series · 14 of 28 positions shown · non-contrast
Comparison: none

[Series 1: us mfm ob detail+14 wk · 70 acquisitions, 14 frames shown]
[im 3/70]
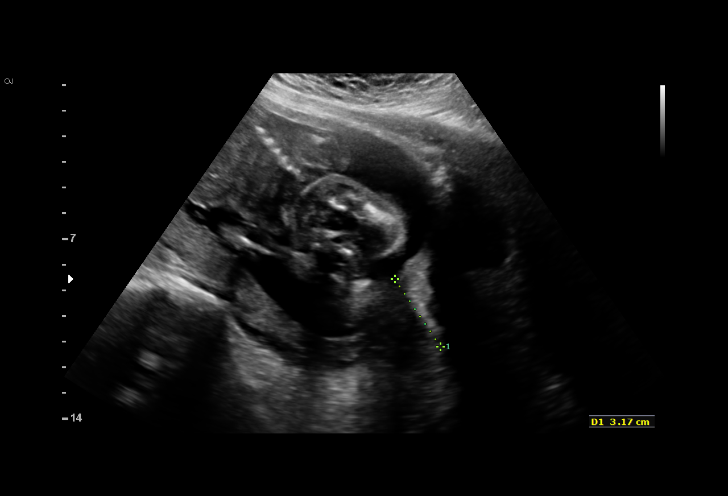
[im 8/70]
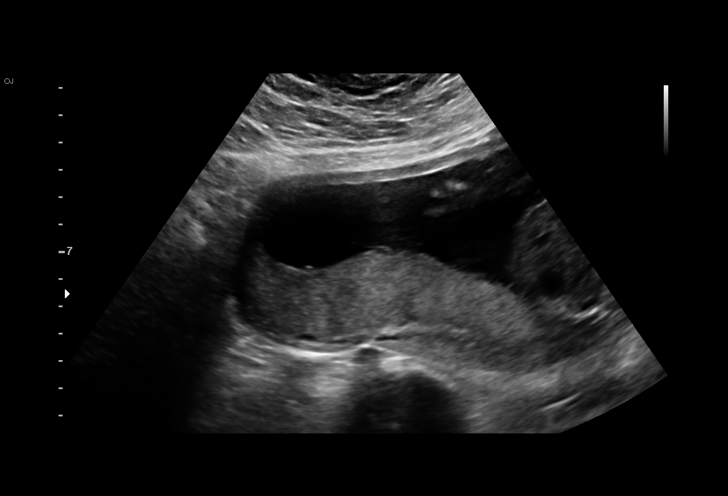
[im 13/70]
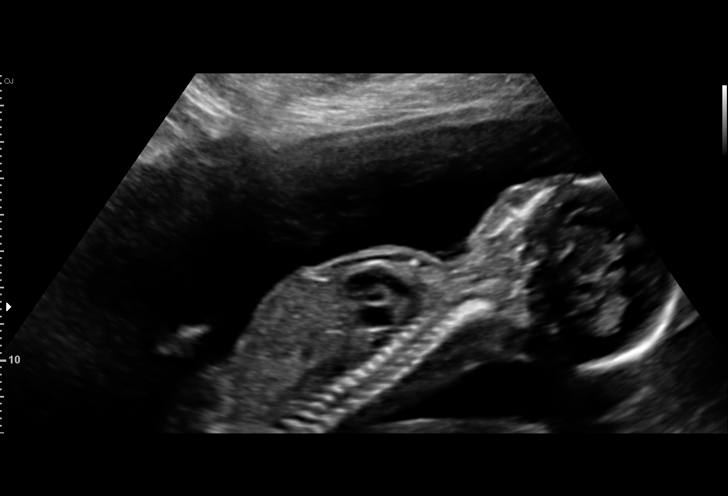
[im 18/70]
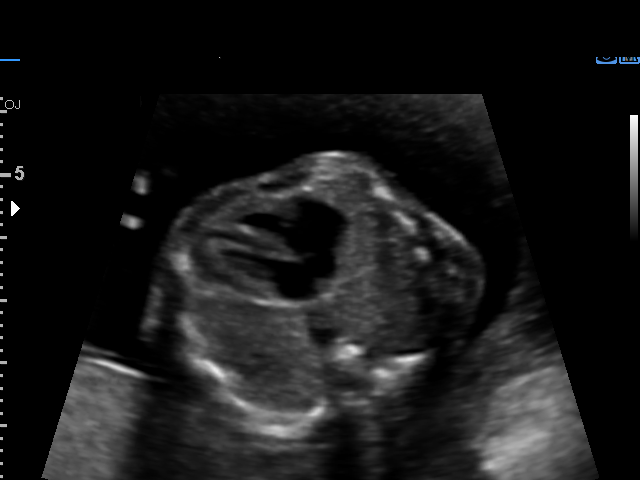
[im 24/70]
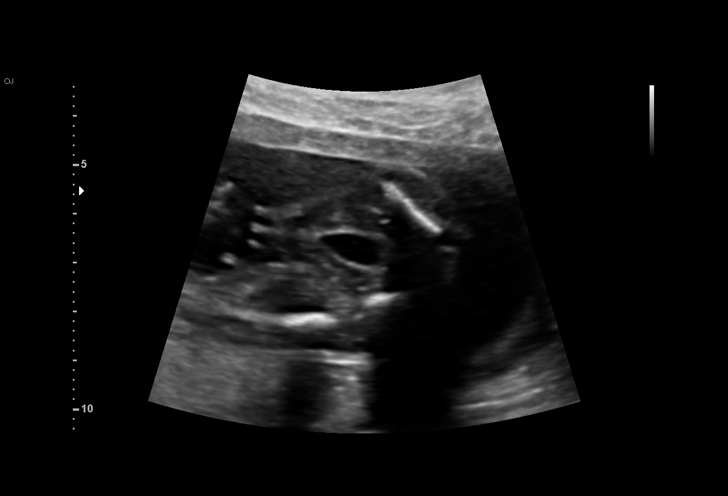
[im 29/70]
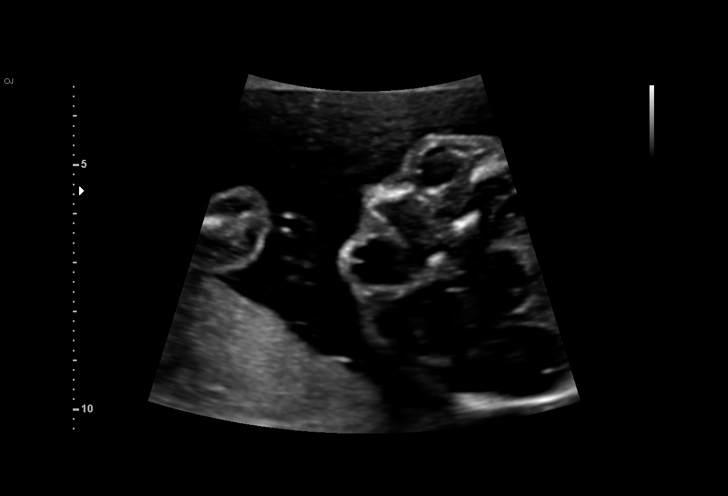
[im 34/70]
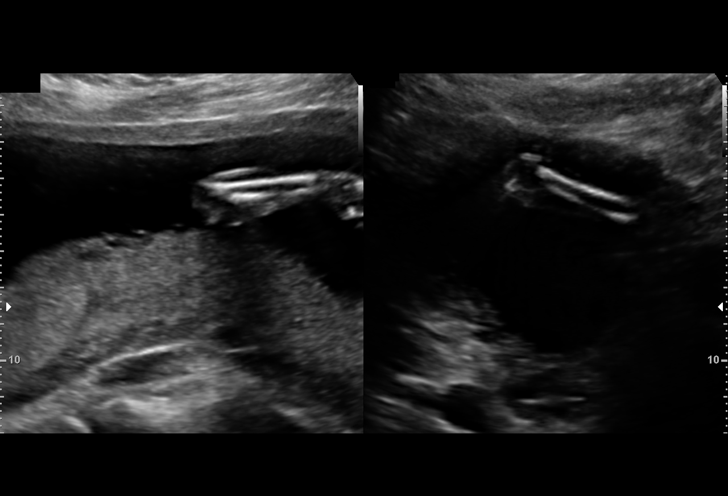
[im 39/70]
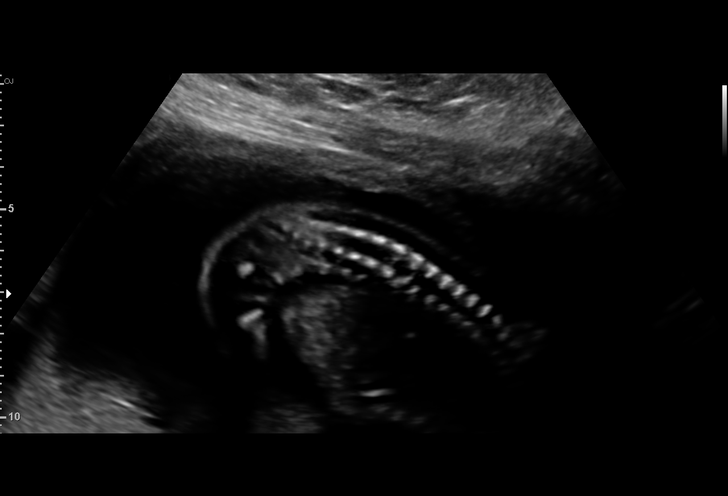
[im 44/70]
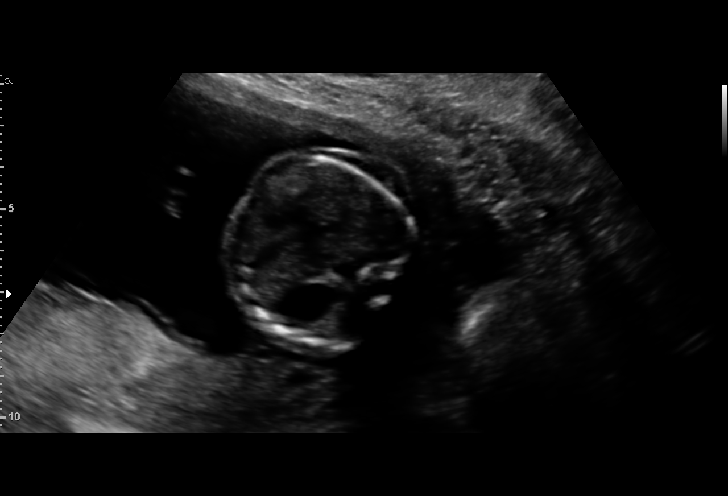
[im 49/70]
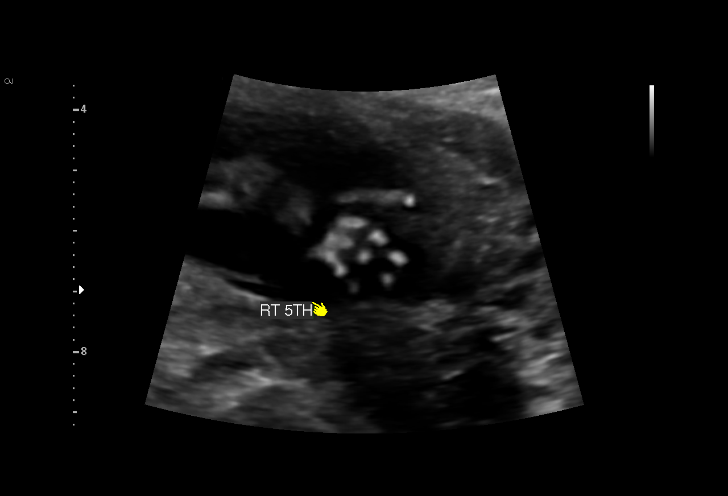
[im 54/70]
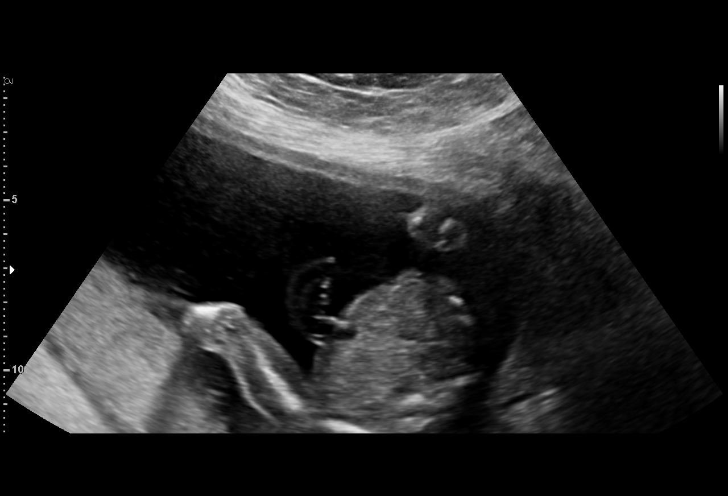
[im 59/70]
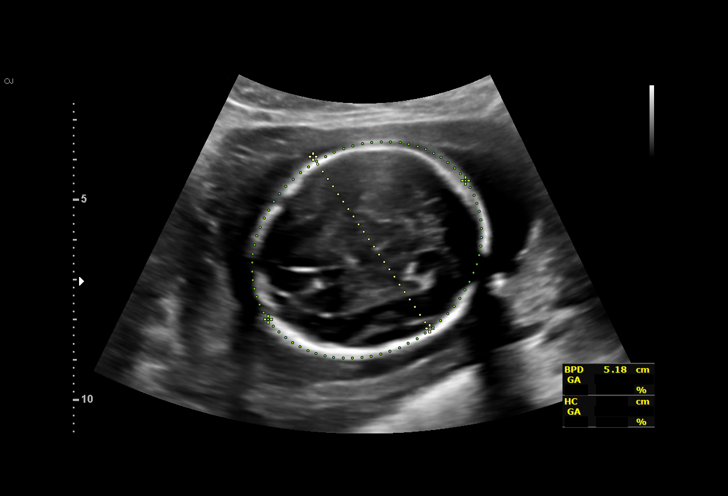
[im 64/70]
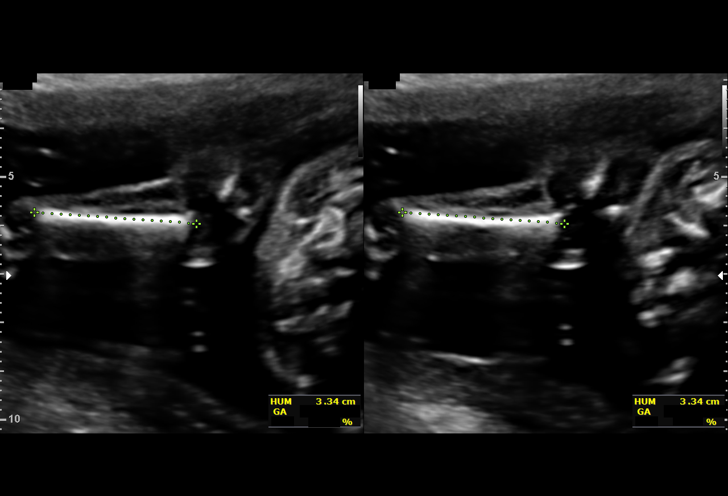
[im 70/70]
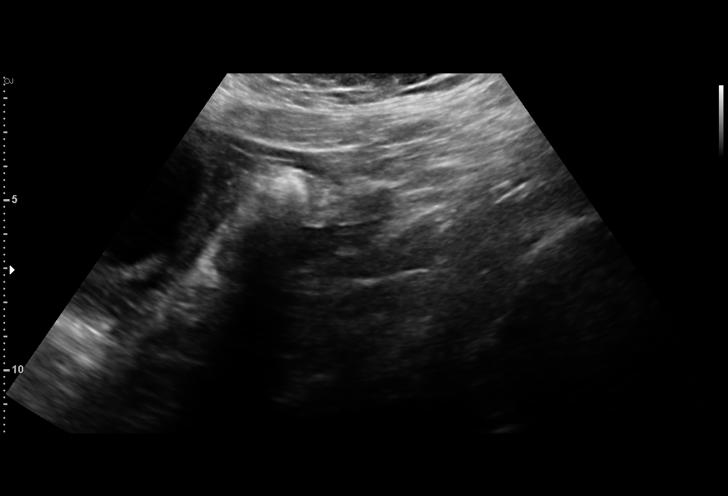

[14 of 28 positions shown; findings below may reference images not displayed]

Road [HOSPITAL]

Indications

19 weeks gestation of pregnancy
Basic anatomic survey                          Z36
Obesity complicating pregnancy, second
trimester
Abnormal ultrasound finding on antenatal
screening of mother
OB History

Gravidity:    2         Term:   0        Prem:   0        SAB:   1
TOP:          0       Ectopic:  0        Living: 0
Fetal Evaluation

Num Of Fetuses:     1
Fetal Heart         142
Rate(bpm):
Cardiac Activity:   Observed
Presentation:       Cephalic
Placenta:           Posterior, above cervical os

Amniotic Fluid
AFI FV:      Subjectively within normal limits

Largest Pocket(cm)
3.6
Biometry

BPD:        51  mm     G. Age:  21w 4d         96  %    CI:        83.99   %   70 - 86
FL/HC:      19.0   %   16.8 -
HC:      175.4  mm     G. Age:  20w 0d         52  %    HC/AC:      1.10       1.09 -
AC:      159.2  mm     G. Age:  21w 0d         80  %    FL/BPD:     65.3   %
FL:       33.3  mm     G. Age:  20w 3d         63  %    FL/AC:      20.9   %   20 - 24
HUM:      33.1  mm     G. Age:  21w 1d         88  %
CER:        19  mm     G. Age:  18w 4d         11  %
NFT:       5.2  mm

CM:        5.6  mm
Est. FW:     373  gm    0 lb 13 oz      60  %
Gestational Age

LMP:           19w 6d       Date:   07/03/15                 EDD:   04/08/16
U/S Today:     20w 5d                                        EDD:   04/02/16
Best:          19w 6d    Det. By:   LMP  (07/03/15)          EDD:   04/08/16
Anatomy

Cranium:               Appears normal         Aortic Arch:            Appears normal
Cavum:                 Appears normal         Ductal Arch:            Appears normal
Ventricles:            Appears normal         Diaphragm:              Appears normal
Choroid Plexus:        Appears normal         Stomach:                Appears normal, left
sided
Cerebellum:            Appears normal         Abdomen:                Appears normal
Posterior Fossa:       Appears normal         Abdominal Wall:         Appears nml (cord
insert, abd wall)
Nuchal Fold:           Appears normal         Cord Vessels:           Appears normal (3
vessel cord)
Face:                  Appears normal         Kidneys:                Appear normal
(orbits and profile)
Lips:                  Appears normal         Bladder:                Appears normal
Thoracic:              Appears normal         Spine:                  Appears normal
Heart:                 Appears normal         Upper Extremities:      Appears normal
(4CH, axis, and situs
RVOT:                  Appears normal         Lower Extremities:      Appears normal
LVOT:                  Appears normal

Other:  Male gender. Heels and 5th digit visualized.
Cervix Uterus Adnexa

Cervix
Length:            3.2  cm.
Normal appearance by transabdominal scan.

Uterus
No abnormality visualized.

Left Ovary
Not visualized. No adnexal mass visualized.

Right Ovary
Within normal limits.
Comments

The nasal bone measures 4.1 mm which is less than the
2.5% for this gestational age.  Hypoplasia of the nasal bone
is associated with an increased risk of Downs Syndrome and
other aneuploidies. Genetic counseling is recommended.
Impression

Single living intrauterine pregnancy at 19 weeks 6 days
Appropriate fetal growth (60%).
Normal amniotic fluid volume.
The fetal anatomic survey is complete.
The nasal bone is hypoplastic (4.1 mm)
Normal fetal anatomy.
No fetal anomalies or soft markers of aneuploidy seen.
Recommendations

Recommend genetic counseling.

## 2018-08-24 ENCOUNTER — Telehealth (HOSPITAL_COMMUNITY): Payer: Self-pay | Admitting: *Deleted

## 2018-10-18 IMAGING — US US MFM OB FOLLOW-UP
1 series · 14 of 28 positions shown · non-contrast
Comparison: none

[Series 1: us mfm ob follow-up · 49 acquisitions, 14 frames shown]
[im 2/49]
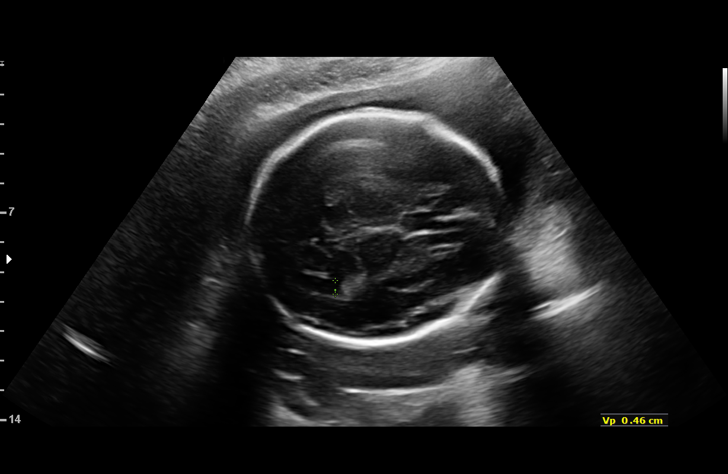
[im 6/49]
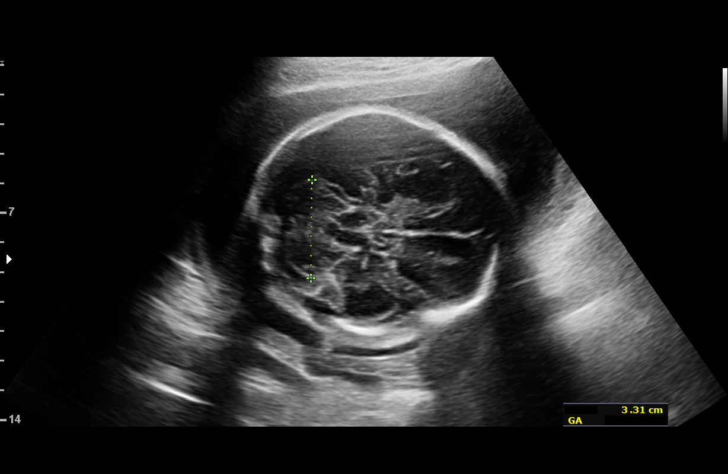
[im 9/49]
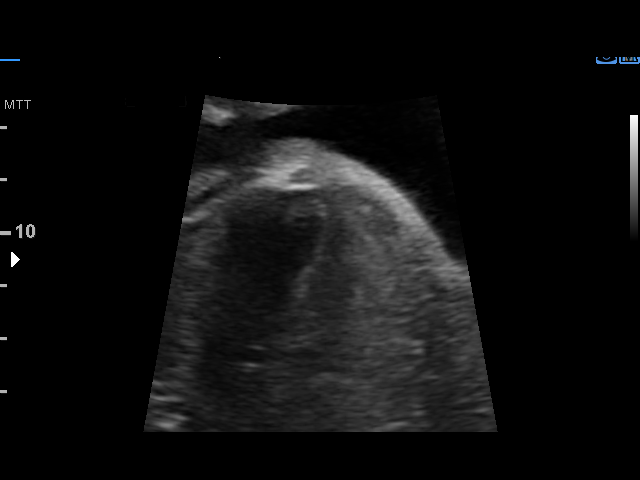
[im 13/49]
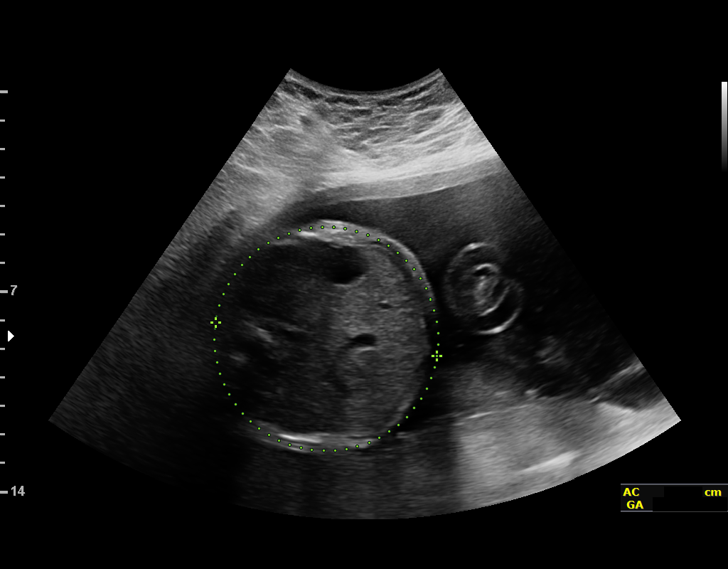
[im 17/49]
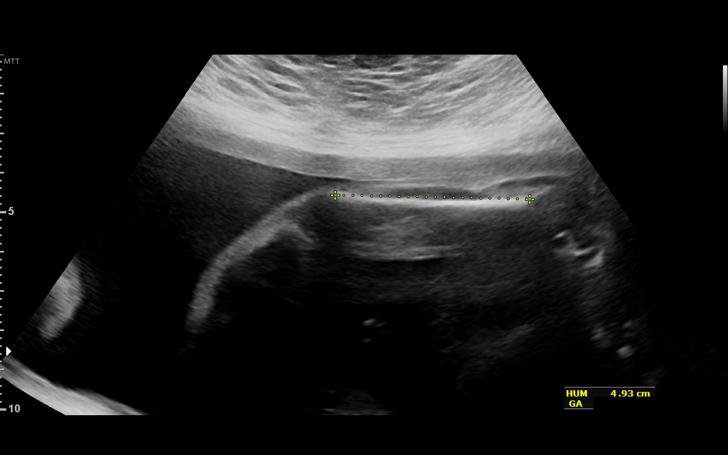
[im 20/49]
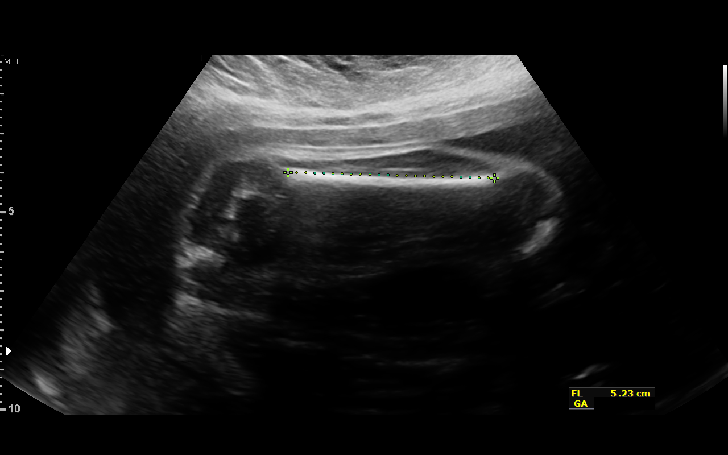
[im 24/49]
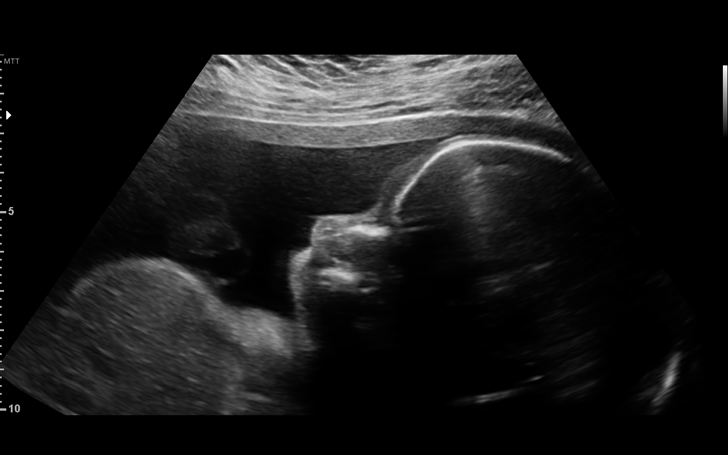
[im 27/49]
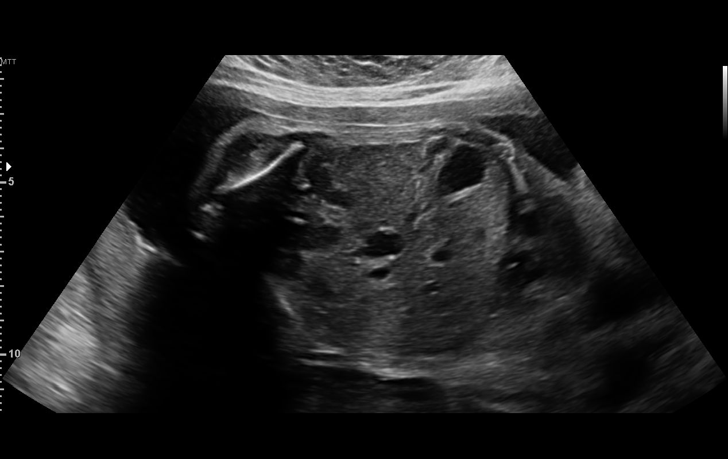
[im 31/49]
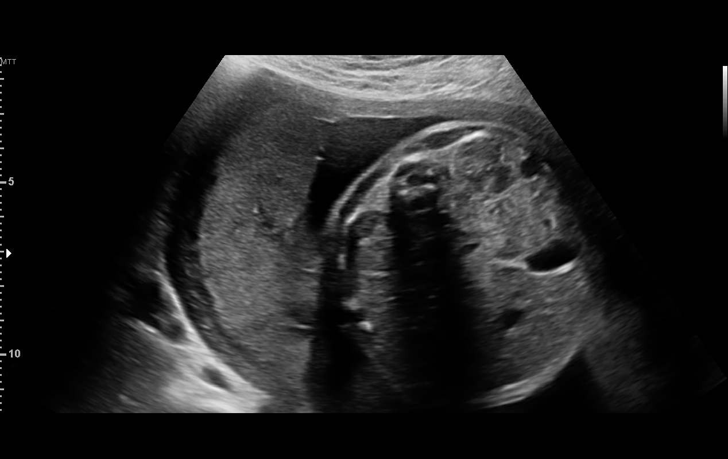
[im 34/49]
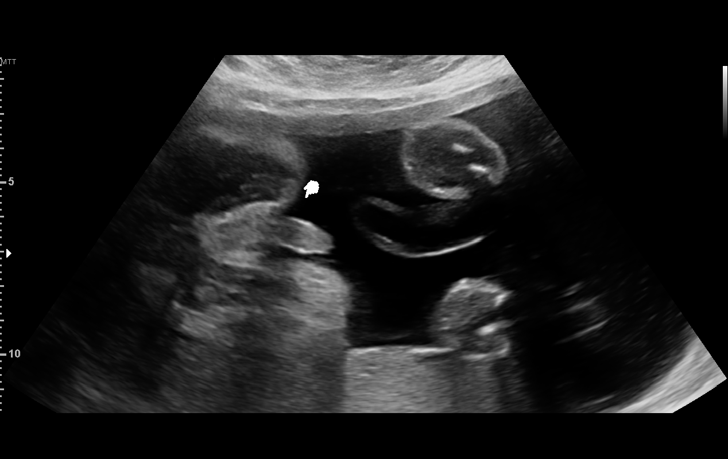
[im 38/49]
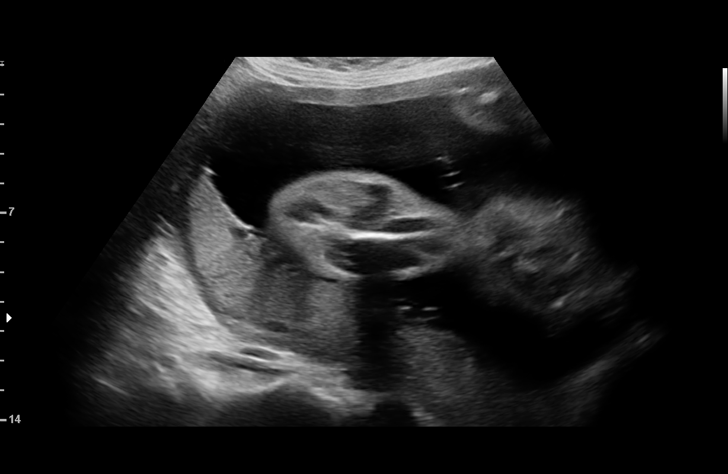
[im 41/49]
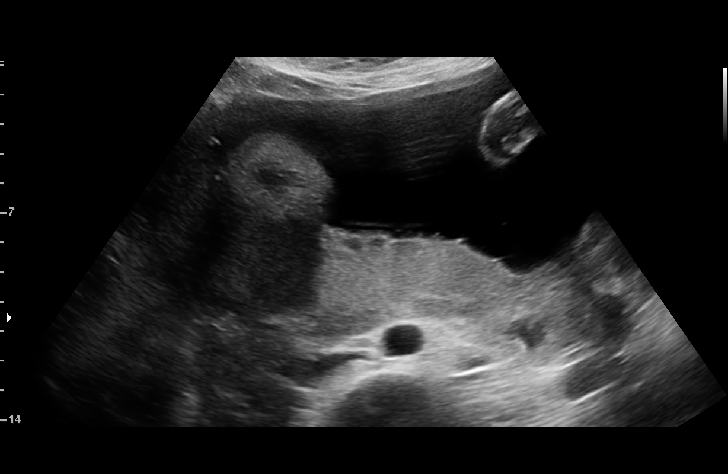
[im 45/49]
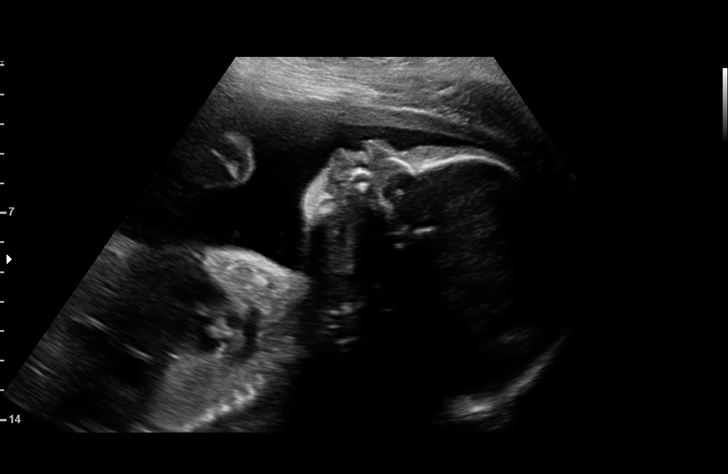
[im 49/49]
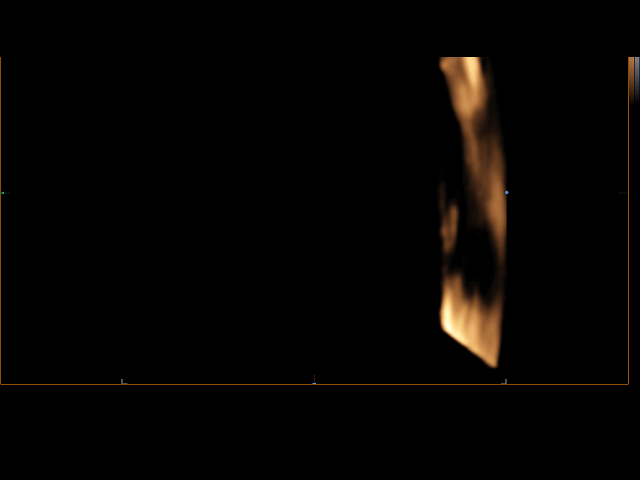

[14 of 28 positions shown; findings below may reference images not displayed]

Road [HOSPITAL]

Indications

27 weeks gestation of pregnancy
Size-Date Discrepancy
Fetal abnormality - other known or
suspected renal pyelectasis)
Obesity complicating pregnancy, second
trimester
OB History

Gravidity:    2         Term:   0        Prem:   0        SAB:   1
TOP:          0       Ectopic:  0        Living: 0
Fetal Evaluation

Num Of Fetuses:     1
Fetal Heart         141
Rate(bpm):
Cardiac Activity:   Observed
Presentation:       Cephalic
Placenta:           Posterior, above cervical os
P. Cord Insertion:  Previously Visualized

Amniotic Fluid
AFI FV:      Subjectively within normal limits

AFI Sum(cm)     %Tile       Largest Pocket(cm)
18.86           73

RUQ(cm)       RLQ(cm)       LUQ(cm)        LLQ(cm)
5.53
Biometry

BPD:      75.6  mm     G. Age:  30w 2d         96  %    CI:        79.18   %    70 - 86
FL/HC:      19.5   %    18.8 -
HC:      268.6  mm     G. Age:  29w 2d         65  %    HC/AC:      1.06        1.05 -
AC:      254.2  mm     G. Age:  29w 4d         88  %    FL/BPD:     69.3   %    71 - 87
FL:       52.4  mm     G. Age:  27w 6d         37  %    FL/AC:      20.6   %    20 - 24
HUM:      49.5  mm     G. Age:  29w 1d         70  %
CER:      33.1  mm     G. Age:  29w 0d         65  %

Est. FW:    5995  gm    2 lb 15 oz      75  %
Gestational Age

LMP:           27w 6d        Date:  07/03/15                 EDD:   04/08/16
U/S Today:     29w 2d                                        EDD:   03/29/16
Best:          27w 6d     Det. By:  LMP  (07/03/15)          EDD:   04/08/16
Anatomy

Cranium:               Appears normal         Aortic Arch:            Previously seen
Cavum:                 Appears normal         Ductal Arch:            Previously seen
Ventricles:            Appears normal         Diaphragm:              Appears normal
Choroid Plexus:        Previously seen        Stomach:                Appears normal, left
sided
Cerebellum:            Appears normal         Abdomen:                Appears normal
Posterior Fossa:       Appears normal         Abdominal Wall:         Previously seen
Nuchal Fold:           Previously seen        Cord Vessels:           Previously seen
Face:                  Appears normal         Kidneys:                Appear normal
(orbits and profile)
Lips:                  Previously seen        Bladder:                Appears normal
Thoracic:              Appears normal         Spine:                  Previously seen
Heart:                 Appears normal         Upper Extremities:      Previously seen
(4CH, axis, and situs
RVOT:                  Previously seen        Lower Extremities:      Previously seen
LVOT:                  Previously seen

Other:  Male gender. Heels and 5th digit visualized.
Cervix Uterus Adnexa

Cervix
Not visualized (advanced GA >38wks)

Left Ovary
Not visualized.

Right Ovary
Not visualized.

Adnexa:       Not Visualized
Impression

Singleton intrauterine pregnancy at 27+6 weeks, previously
noted to have hypoplastic nasal bone
Review of the anatomy shows no sonographic markers for
aneuploidy or structural anomalies on today's exam
Amniotic fluid volume is normal with an AFI of 18.9 cm
Estimated fetal weight is 1331g which is growth in the 75th
percentile
Recommendations

The patient evidently did not desire genetic counseling.
normal growth and development. Follow-up ultrasounds as
clinically indicated.

## 2019-01-24 LAB — OB RESULTS CONSOLE GBS: GBS: NEGATIVE

## 2019-02-01 ENCOUNTER — Telehealth (HOSPITAL_COMMUNITY): Payer: Self-pay | Admitting: *Deleted

## 2019-02-01 NOTE — Telephone Encounter (Signed)
Preadmission screen  

## 2019-02-07 ENCOUNTER — Telehealth (HOSPITAL_COMMUNITY): Payer: Self-pay | Admitting: *Deleted

## 2019-02-07 NOTE — Telephone Encounter (Signed)
Preadmission screen  

## 2019-02-08 ENCOUNTER — Telehealth (HOSPITAL_COMMUNITY): Payer: Self-pay | Admitting: *Deleted

## 2019-02-08 NOTE — Telephone Encounter (Signed)
Preadmission screen  

## 2019-02-09 ENCOUNTER — Telehealth (HOSPITAL_COMMUNITY): Payer: Self-pay | Admitting: *Deleted

## 2019-02-09 NOTE — Telephone Encounter (Signed)
Preadmission screen  

## 2019-02-12 ENCOUNTER — Other Ambulatory Visit (HOSPITAL_COMMUNITY)
Admission: RE | Admit: 2019-02-12 | Discharge: 2019-02-12 | Disposition: A | Discharge status: Home / Self Care | Payer: Medicaid Other | Source: Ambulatory Visit | Attending: Obstetrics and Gynecology | Admitting: Obstetrics and Gynecology

## 2019-02-12 LAB — SARS CORONAVIRUS 2 (TAT 6-24 HRS): SARS Coronavirus 2: NEGATIVE

## 2019-02-13 ENCOUNTER — Other Ambulatory Visit: Payer: Self-pay

## 2019-02-13 ENCOUNTER — Inpatient Hospital Stay (HOSPITAL_COMMUNITY)
Admission: AD | Admit: 2019-02-13 | Discharge: 2019-02-14 | DRG: 807 | Disposition: A | Discharge status: Home / Self Care | Payer: Medicaid Other | Attending: Obstetrics and Gynecology | Admitting: Obstetrics and Gynecology

## 2019-02-13 ENCOUNTER — Encounter (HOSPITAL_COMMUNITY): Payer: Self-pay | Admitting: Obstetrics and Gynecology

## 2019-02-13 DIAGNOSIS — O4292 Full-term premature rupture of membranes, unspecified as to length of time between rupture and onset of labor: Secondary | ICD-10-CM | POA: Diagnosis present

## 2019-02-13 DIAGNOSIS — Z20828 Contact with and (suspected) exposure to other viral communicable diseases: Secondary | ICD-10-CM | POA: Diagnosis present

## 2019-02-13 DIAGNOSIS — Z3A39 39 weeks gestation of pregnancy: Secondary | ICD-10-CM | POA: Diagnosis not present

## 2019-02-13 DIAGNOSIS — Z87891 Personal history of nicotine dependence: Secondary | ICD-10-CM | POA: Diagnosis not present

## 2019-02-13 LAB — TYPE AND SCREEN
ABO/RH(D): O POS
Antibody Screen: NEGATIVE

## 2019-02-13 LAB — POCT FERN TEST: POCT Fern Test: POSITIVE

## 2019-02-13 LAB — CBC
HCT: 37.3 % (ref 36.0–46.0)
Hemoglobin: 11.9 g/dL — ABNORMAL LOW (ref 12.0–15.0)
MCH: 28.5 pg (ref 26.0–34.0)
MCHC: 31.9 g/dL (ref 30.0–36.0)
MCV: 89.2 fL (ref 80.0–100.0)
Platelets: 261 K/uL (ref 150–400)
RBC: 4.18 MIL/uL (ref 3.87–5.11)
RDW: 15.3 % (ref 11.5–15.5)
WBC: 8.3 K/uL (ref 4.0–10.5)
nRBC: 0 % (ref 0.0–0.2)

## 2019-02-13 LAB — SYPHILIS: RPR W/REFLEX TO RPR TITER AND TREPONEMAL ANTIBODIES, TRADITIONAL SCREENING AND DIAGNOSIS ALGORITHM: RPR Ser Ql: NONREACTIVE

## 2019-02-13 MED ORDER — ACETAMINOPHEN 325 MG PO TABS: 650.0000 mg | ORAL_TABLET | ORAL | Status: DC | PRN

## 2019-02-13 MED ORDER — LIDOCAINE HCL (PF) 1 % IJ SOLN
30.0000 mL | INTRAMUSCULAR | Status: AC | PRN
  Administered 2019-02-13: 30 mL via SUBCUTANEOUS
  Filled 2019-02-13 (×2): qty 30

## 2019-02-13 MED ORDER — SIMETHICONE 80 MG PO CHEW: 80.0000 mg | CHEWABLE_TABLET | ORAL | Status: DC | PRN

## 2019-02-13 MED ORDER — ONDANSETRON HCL 4 MG/2ML IJ SOLN: 4.0000 mg | Freq: Four times a day (QID) | INTRAMUSCULAR | Status: DC | PRN

## 2019-02-13 MED ORDER — ONDANSETRON HCL 4 MG/2ML IJ SOLN: 4.0000 mg | INTRAMUSCULAR | Status: DC | PRN

## 2019-02-13 MED ORDER — SENNOSIDES-DOCUSATE SODIUM 8.6-50 MG PO TABS
2.0000 | ORAL_TABLET | ORAL | Status: DC
  Administered 2019-02-14: 2 via ORAL
  Filled 2019-02-13: qty 2

## 2019-02-13 MED ORDER — FLEET ENEMA 7-19 GM/118ML RE ENEM
1.0000 | ENEMA | RECTAL | Status: DC | PRN
  Filled 2019-02-13: qty 1

## 2019-02-13 MED ORDER — LACTATED RINGERS IV SOLN: INTRAVENOUS | Status: DC

## 2019-02-13 MED ORDER — FENTANYL CITRATE (PF) 100 MCG/2ML IJ SOLN
100.0000 ug | INTRAMUSCULAR | Status: DC | PRN
  Administered 2019-02-13: 100 ug via INTRAVENOUS
  Filled 2019-02-13: qty 2

## 2019-02-13 MED ORDER — DIBUCAINE (PERIANAL) 1 % EX OINT: 1.0000 "application " | TOPICAL_OINTMENT | CUTANEOUS | Status: DC | PRN

## 2019-02-13 MED ORDER — WITCH HAZEL-GLYCERIN EX PADS: 1.0000 "application " | MEDICATED_PAD | CUTANEOUS | Status: DC | PRN

## 2019-02-13 MED ORDER — ONDANSETRON HCL 4 MG PO TABS: 4.0000 mg | ORAL_TABLET | ORAL | Status: DC | PRN

## 2019-02-13 MED ORDER — PRENATAL MULTIVITAMIN CH
1.0000 | ORAL_TABLET | Freq: Every day | ORAL | Status: DC
  Administered 2019-02-14: 1 via ORAL
  Filled 2019-02-13: qty 1

## 2019-02-13 MED ORDER — LACTATED RINGERS IV SOLN: 500.0000 mL | INTRAVENOUS | Status: DC | PRN

## 2019-02-13 MED ORDER — DIPHENHYDRAMINE HCL 25 MG PO CAPS: 25.0000 mg | ORAL_CAPSULE | Freq: Four times a day (QID) | ORAL | Status: DC | PRN

## 2019-02-13 MED ORDER — OXYTOCIN 40 UNITS IN NORMAL SALINE INFUSION - SIMPLE MED
1.0000 m[IU]/min | INTRAVENOUS | Status: DC
  Administered 2019-02-13: 2 m[IU]/min via INTRAVENOUS
  Filled 2019-02-13: qty 1000

## 2019-02-13 MED ORDER — OXYTOCIN 40 UNITS IN NORMAL SALINE INFUSION - SIMPLE MED: 2.5000 [IU]/h | INTRAVENOUS | Status: DC

## 2019-02-13 MED ORDER — BENZOCAINE-MENTHOL 20-0.5 % EX AERO
1.0000 "application " | INHALATION_SPRAY | CUTANEOUS | Status: DC | PRN
  Administered 2019-02-13: 1 via TOPICAL
  Filled 2019-02-13: qty 56

## 2019-02-13 MED ORDER — OXYTOCIN BOLUS FROM INFUSION
500.0000 mL | Freq: Once | INTRAVENOUS | Status: AC
  Administered 2019-02-13: 17:00:00 500 mL via INTRAVENOUS

## 2019-02-13 MED ORDER — BUSPIRONE HCL 5 MG PO TABS
10.0000 mg | ORAL_TABLET | Freq: Two times a day (BID) | ORAL | Status: DC
  Administered 2019-02-13 – 2019-02-14 (×3): 10 mg via ORAL
  Filled 2019-02-13: qty 2
  Filled 2019-02-13 (×2): qty 1
  Filled 2019-02-13: qty 2

## 2019-02-13 MED ORDER — TETANUS-DIPHTH-ACELL PERTUSSIS 5-2.5-18.5 LF-MCG/0.5 IM SUSP: 0.5000 mL | Freq: Once | INTRAMUSCULAR | Status: DC

## 2019-02-13 MED ORDER — SOD CITRATE-CITRIC ACID 500-334 MG/5ML PO SOLN: 30.0000 mL | ORAL | Status: DC | PRN

## 2019-02-13 MED ORDER — OXYCODONE-ACETAMINOPHEN 5-325 MG PO TABS: 2.0000 | ORAL_TABLET | ORAL | Status: DC | PRN

## 2019-02-13 MED ORDER — TERBUTALINE SULFATE 1 MG/ML IJ SOLN
0.2500 mg | Freq: Once | INTRAMUSCULAR | Status: DC | PRN
  Filled 2019-02-13: qty 1

## 2019-02-13 MED ORDER — IBUPROFEN 600 MG PO TABS
600.0000 mg | ORAL_TABLET | Freq: Four times a day (QID) | ORAL | Status: DC
  Administered 2019-02-13 – 2019-02-14 (×4): 600 mg via ORAL
  Filled 2019-02-13 (×4): qty 1

## 2019-02-13 MED ORDER — OXYCODONE-ACETAMINOPHEN 5-325 MG PO TABS: 1.0000 | ORAL_TABLET | ORAL | Status: DC | PRN

## 2019-02-13 MED ORDER — ZOLPIDEM TARTRATE 5 MG PO TABS: 5.0000 mg | ORAL_TABLET | Freq: Every evening | ORAL | Status: DC | PRN

## 2019-02-13 MED ORDER — SERTRALINE HCL 100 MG PO TABS
100.0000 mg | ORAL_TABLET | Freq: Two times a day (BID) | ORAL | Status: DC
  Administered 2019-02-14 (×2): 100 mg via ORAL
  Filled 2019-02-13 (×2): qty 1

## 2019-02-13 MED ORDER — ACETAMINOPHEN 325 MG PO TABS
650.0000 mg | ORAL_TABLET | ORAL | Status: DC | PRN
  Administered 2019-02-13 – 2019-02-14 (×2): 650 mg via ORAL
  Filled 2019-02-13 (×3): qty 2

## 2019-02-13 MED ORDER — COCONUT OIL OIL: 1.0000 "application " | TOPICAL_OIL | Status: DC | PRN

## 2019-02-13 NOTE — MAU Note (Signed)
Pt c/o a large gush of clear fluid that occurred tonight at 0200. Pt reports normal fetal movement. Pt denies bleeding. Pt reports some mild cramping. Pt denies problems with this pregnancy.

## 2019-02-13 NOTE — H&P (Signed)
Michele Wells is a 33 y.o. female presenting for rupture of membranes at 0200. Gross clear fluid, +FM, deneis VB and regular contractions  PNC c/b BMI42 (normal early and 28wk glucolas), anx/dep (buspar 10mg  BID, Zoloft QHS). VZNI, GBS neg, elevated T21 risk with normal maternity21 OB History    Gravida  3   Para  1   Term  1   Preterm  0   AB  1   Living  1     SAB  1   TAB  0   Ectopic  0   Multiple  0   Live Births  1          Past Medical History:  Diagnosis Date  . Anxiety   . Depression   . Vaginal Pap smear, abnormal    Past Surgical History:  Procedure Laterality Date  . APPENDECTOMY    . COLPOSCOPY    . WISDOM TOOTH EXTRACTION     Family History: family history includes Breast cancer in her maternal grandmother; Diabetes in her maternal grandmother; Heart disease in her maternal grandfather and paternal grandfather; Skin cancer in her mother; Thyroid cancer in her paternal grandmother. Social History:  reports that she quit smoking about 11 years ago. Her smoking use included cigarettes. She has a 12.00 pack-year smoking history. She has never used smokeless tobacco. She reports that she does not drink alcohol or use drugs.     Maternal Diabetes: No (121, 118) Genetic Screening: Abnormal:  Results: Elevated risk of Trisomy 21, Other: followed by normal materniti21 Maternal Ultrasounds/Referrals: Normal Fetal Ultrasounds or other Referrals:  Referred to Materal Fetal Medicine  after initial first trimester screen Maternal Substance Abuse:  No Significant Maternal Medications:  Meds include: Zoloft Other: Buspar Significant Maternal Lab Results:  Group B Strep negative and Other: VZNI Other Comments:  None  Review of Systems  Constitutional: Negative for chills and fever.  Respiratory: Negative for shortness of breath.   Cardiovascular: Negative for chest pain, palpitations and leg swelling.  Gastrointestinal: Negative for abdominal pain and  vomiting.  Genitourinary: Positive for vaginal discharge.  Neurological: Negative for dizziness, weakness and headaches.  Psychiatric/Behavioral: Negative for suicidal ideas.   Maternal Medical History:  Reason for admission: Rupture of membranes.  0200, clear  Fetal activity: Perceived fetal activity is normal.   Last perceived fetal movement was within the past hour.    Prenatal Complications - Diabetes: none.    Dilation: 2 Effacement (%): 50 Station: -2 Exam by:: Michele Wells @ 0415 Blood pressure 137/73, pulse 82, temperature 99.2 F (37.3 C), temperature source Oral, resp. rate 18, height 5\' 2"  (1.575 m), weight 123.4 kg, last menstrual period 05/10/2018, SpO2 99 %, currently breastfeeding. Maternal Exam:  Introitus: Vagina is positive for vaginal discharge.    Physical Exam  Constitutional: She is oriented to person, place, and time. She appears well-developed and well-nourished. No distress.  HENT:  Head: Normocephalic and atraumatic.  Eyes: Pupils are equal, round, and reactive to light.  Cardiovascular: Normal rate and regular rhythm. Exam reveals no gallop.  No murmur heard. GI: There is no abdominal tenderness. There is no rebound and no guarding.  Genitourinary:    Uterus normal.     Vaginal discharge present.   Musculoskeletal:        General: Normal range of motion.     Cervical back: Normal range of motion and neck supple.  Neurological: She is alert and oriented to person, place, and time.  Skin:  Skin is warm and dry.   Confirmed gross rupture  Prenatal labs: ABO, Rh: O/Positive/-- (06/08 0000) Antibody: Negative (06/08 0000) Rubella: Immune (06/08 0000) RPR: Nonreactive (06/08 0000)  HBsAg: Negative (06/08 0000)  HIV: Non-reactive (06/08 0000)  GBS: Negative/-- (11/25 0000)   Category 1 , baseline 135, mod var, +a ccels. One isolated decel at  Assessment/Plan: This is a 33yo G3P1011 @ 39 6/7 admitted with PROM. GBS neg, pelvis proven to 8lb4oz,  EFW 3550g. PNC c/b BMI 42, Anx/Dep, elevated T21 risk with neg follow-up  Admit to L&D. Cat 1 tracing, no activity on toco therefore intiaite pitocin and titrate per protocol. CEFm CLD. Declining epidural for now. Baby Girl (sienna)   [name] 02/13/2019, 4:26 AM

## 2019-02-13 NOTE — Progress Notes (Signed)
Patient ID: Michele Wells, female   DOB: December 18, 1985, 34 y.o.   MRN: [id number] Pt feeling increasing pain, but tolerating well  afeb vss  FHR category 1 with occasional variable decelerations  Cervix 80/4+/-1 OP  Continue induction and follow progress D/w pt position changes for delivery

## 2019-02-13 NOTE — Progress Notes (Signed)
Patient ID: Michele Wells, female   DOB: 03-24-1985, 33 y.o.   MRN: [id number] Pt increasingly uncomfortable with contractions  afeb VSS  FHR category 1  Cervix 80/5/-1  Continue pitocin, following progress

## 2019-02-13 NOTE — Progress Notes (Signed)
Patient ID: Michele Wells, female   DOB: [birth year], 33 y.o.   MRN: [id number] Pt currently on pitocin and feeling mild contractions, tolerating well  afeb VSS FHR category 1  Cervix 50/2/-2 Possible fore bag ruptured with amniohook   Pitocin at 10 mu Pt not planning epidural Continue pitocin

## 2019-02-13 NOTE — Lactation Note (Addendum)
This note was copied from a baby's chart. Lactation Consultation Note  Patient Name: Michele Wells [id number] Date: 02/13/2019 Reason for consult: Initial assessment;Term P2, 5 hour female infant. Mom's feeding choice at admission is breast and formula feeding. Mom with hx of anxiety  and depression, per mom she stopped Zoloft -L2 in her pregnancy due it making her sick and she continues to take Buspar -L3 daily. Per mom, she took both medications when breastfeeding her 24 year old son for 14 months. Per mom, she feels infant is breastfeeding well unlike her brother, she has breastfeed infant 3 times since delivery. Per mom, she did have difficulties  In the beginning with her son until he got his tongue clipped he was" tongue tied". Mom is active  on the [facility] program in [facility] and she has medela DEBP at home.  LC did not observe latch at this time mom had finished feeding infant when [facility] entered room and she was doing STS with infant. Per mom, infant breastfed off and on for 30 minutes and she felt it was a good feeding. LC reviewed hand expression with mom and infant was given 3 mls of colostrum by spoon and appeared content. Mom knows to breastfeed by hunger cues, on demand, 8 to 12 times within 24 hours and not exceed 3 hours without breastfeeding infant. Mom knows to call RN or LC if she has any questions, concerns or need assistance with latching infant to breast. Reviewed Baby & Me book's Breastfeeding Basics.  Mom made aware of O/P services, breastfeeding support groups, community resources, and our phone # for post-discharge questions.    Maternal Data Formula Feeding for Exclusion: Yes Reason for exclusion: Mother's choice to formula and breast feed on admission Has patient been taught Hand Expression?: Yes(Infant was given 3 mls of colostrum by spoon. ) Does the patient have breastfeeding experience prior to this delivery?: Yes  Feeding Feeding Type: Breast  Fed  LATCH Score                   Interventions Interventions: Breast feeding basics reviewed;Skin to skin;Hand express;Expressed milk;DEBP  Lactation Tools Discussed/Used WIC Program: Yes   Consult Status Consult Status: Follow-up Date: 02/14/19 Follow-up type: In-patient    Michele Wells 02/13/2019, 10:57 PM

## 2019-02-14 ENCOUNTER — Inpatient Hospital Stay (HOSPITAL_COMMUNITY): Payer: Medicaid Other

## 2019-02-14 ENCOUNTER — Inpatient Hospital Stay (HOSPITAL_COMMUNITY)
Admission: AD | Admit: 2019-02-14 | Payer: Medicaid Other | Source: Home / Self Care | Admitting: Obstetrics and Gynecology

## 2019-02-14 LAB — CBC
HCT: 32.9 % — ABNORMAL LOW (ref 36.0–46.0)
Hemoglobin: 10.8 g/dL — ABNORMAL LOW (ref 12.0–15.0)
MCH: 29 pg (ref 26.0–34.0)
MCHC: 32.8 g/dL (ref 30.0–36.0)
MCV: 88.2 fL (ref 80.0–100.0)
Platelets: 231 10*3/uL (ref 150–400)
RBC: 3.73 MIL/uL — ABNORMAL LOW (ref 3.87–5.11)
RDW: 15.7 % — ABNORMAL HIGH (ref 11.5–15.5)
WBC: 10.9 10*3/uL — ABNORMAL HIGH (ref 4.0–10.5)
nRBC: 0 % (ref 0.0–0.2)

## 2019-02-14 LAB — ABO/RH: ABO/RH(D): O POS

## 2019-02-14 MED ORDER — IBUPROFEN 600 MG PO TABS: 600.0000 mg | ORAL_TABLET | Freq: Four times a day (QID) | ORAL | 1 refills | Status: DC | PRN

## 2019-02-14 NOTE — Discharge Instructions (Signed)
Call office with any concerns [phone] 

## 2019-02-14 NOTE — Discharge Summary (Signed)
OB Discharge Summary     Patient Name: Michele Wells DOB: Jan 29, 1986 MRN: [id number]  Date of admission: 02/13/2019 Delivering MD: Paula Compton   Date of discharge: 02/14/2019  Admitting diagnosis: [redacted] weeks gestation of pregnancy [Z3A.39] NSVD (normal spontaneous vaginal delivery) [O80] Intrauterine pregnancy: [redacted]w[redacted]d     Secondary diagnosis:  Active Problems:   [redacted] weeks gestation of pregnancy   NSVD (normal spontaneous vaginal delivery)  Additional problems: none     Discharge diagnosis: Term Pregnancy Delivered                                                                                                Post partum procedures:none  Augmentation: AROM  Complications: None  Hospital course:  Onset of Labor With Vaginal Delivery     33 y.o. yo [id number] at [redacted]w[redacted]d was admitted in Latent Labor on 02/13/2019. Patient had an uncomplicated labor course as follows:  Membrane Rupture Time/Date: 2:00 AM ,02/13/2019   Intrapartum Procedures: Episiotomy: None [1]                                         Lacerations:  2nd degree [3]  Patient had a delivery of a Viable infant. 02/13/2019  Information for the patient's newborn:  Joy, Reiger [[id number]]  Delivery Method: Vaginal, Spontaneous(Filed from Delivery Summary)     Pateint had an uncomplicated postpartum course.  She is ambulating, tolerating a regular diet, passing flatus, and urinating well. Patient is discharged home in stable condition on 02/14/19.   Physical exam  Vitals:   02/13/19 1940 02/14/19 0000 02/14/19 0400 02/14/19 0900  BP: 101/64 (!) 111/51 (!) 100/55 (!) 115/55  Pulse: 80 75 80 72  Resp: 18 18 20 20   Temp: 99.2 F (37.3 C) 98.2 F (36.8 C) 98.2 F (36.8 C) 98.2 F (36.8 C)  TempSrc:  Oral Oral Oral  SpO2: 99% 100% 95% 100%  Weight:      Height:       General: alert, cooperative and no distress Lochia: appropriate Uterine Fundus: firm Incision: N/A DVT Evaluation: No evidence of  DVT seen on physical exam. Labs: Lab Results  Component Value Date   WBC 10.9 (H) 02/14/2019   HGB 10.8 (L) 02/14/2019   HCT 32.9 (L) 02/14/2019   MCV 88.2 02/14/2019   PLT 231 02/14/2019   CMP Latest Ref Rng & Units 04/17/2016  Glucose 65 - 99 mg/dL 104(H)  BUN 6 - 20 mg/dL 12  Creatinine 0.44 - 1.00 mg/dL 0.60  Sodium 135 - 145 mmol/L 134(L)  Potassium 3.5 - 5.1 mmol/L 3.8  Chloride 101 - 111 mmol/L 104  CO2 22 - 32 mmol/L 20(L)  Calcium 8.9 - 10.3 mg/dL 8.6(L)  Total Protein 6.5 - 8.1 g/dL 7.2  Total Bilirubin 0.3 - 1.2 mg/dL 0.9  Alkaline Phos 38 - 126 U/L 115  AST 15 - 41 U/L 27  ALT 14 - 54 U/L 19    Discharge instruction: per After Visit Summary and "Baby  and Me Booklet".  After visit meds:  Allergies as of 02/14/2019   No Known Allergies     Medication List    TAKE these medications   B-12 PO Take by mouth.   busPIRone 10 MG tablet Commonly known as: BUSPAR Take 10 mg by mouth 2 (two) times daily.   cholecalciferol 25 MCG (1000 UT) tablet Commonly known as: VITAMIN D3 Take 5,000 Units by mouth once a week.   doxylamine (Sleep) 25 MG tablet Commonly known as: UNISOM Take 25 mg by mouth at bedtime as needed.   ibuprofen 600 MG tablet Commonly known as: ADVIL Take 1 tablet (600 mg total) by mouth every 6 (six) hours as needed for cramping. What changed:   when to take this  reasons to take this   sertraline 100 MG tablet Commonly known as: ZOLOFT Take 100 mg by mouth 2 (two) times daily.   VITAFOL GUMMIES PO Take 2 capsules by mouth.   vitamin C 100 MG tablet Take 100 mg by mouth daily.       Diet: routine diet  Activity: Advance as tolerated. Pelvic rest for 6 weeks.   Outpatient follow up:6 weeks Follow up Appt:No future appointments. Follow up Visit:No follow-ups on file.  Postpartum contraception: Not Discussed  Newborn Data: Live born female  Birth Weight: 7 lb 5.1 oz (3320 g) APGAR: 8, 9  Newborn Delivery   Birth  date/time: 02/13/2019 17:13:00 Delivery type: Vaginal, Spontaneous      Baby Feeding: Breast Disposition:home with mother   02/14/2019 Cathrine Muster, DO

## 2019-02-14 NOTE — Lactation Note (Signed)
This note was copied from a baby's chart. Lactation Consultation Note  Patient Name: Michele Wells [id number] Date: 02/14/2019   [facility] visit attempted, but Mom appeared to be sleeping/resting. Dad was sitting on couch, holding infant.   Matthias Hughs [facility] 02/14/2019, 2:27 PM

## 2019-02-14 NOTE — Lactation Note (Addendum)
This note was copied from a baby's chart. Lactation Consultation Note  Patient Name: Michele Wells [id number] Date: 02/14/2019 Reason for consult: Follow-up assessment  1833 - 1845 - I followed up with Michele Wells. She states that her daughter, Michele Wells, has caused some nipple trauma through poor latch at times. She was using a pacifier upon entry because she felt like baby needed additional suck time. She notes that baby has a good latch when she's rhythmically suckling, but at other times she clamps down on the nipple.  I discussed reasons for disorganized behavior at the breast, when to expect mature milk to transition, and day 2 infant feeding patterns and cluster feeding.  Michele Wells is requesting an evening discharge. I shared our support group resources and our OP lactation resources. We discussed when to pump, and I encouraged her to offer the breast on demand prior to any supplementation (if she so chooses to supplement). We discussed when to pump (she had some questions about different advice offered to her prior to delivery). All questions answered at this time.   Maternal Data Has patient been taught Hand Expression?: Yes  Feeding Feeding Type: Breast Fed   Interventions Interventions: Breast feeding basics reviewed    Consult Status Consult Status: Complete Follow-up type: Call as needed    Lenore Manner 02/14/2019, 6:53 PM

## 2019-02-14 NOTE — Progress Notes (Signed)
Post Partum Day 1 Subjective: no complaints, up ad lib, voiding, tolerating PO, + flatus and lochia mild. Breastfeeding well. She denies HA, blurry vision, SOB or dizziness. She is bonding well with baby. She requests early discharge to home  Objective: Blood pressure (!) 115/55, pulse 72, temperature 98.2 F (36.8 C), temperature source Oral, resp. rate 20, height 5\' 2"  (1.575 m), weight 123.4 kg, last menstrual period 05/10/2018, SpO2 100 %, unknown if currently breastfeeding.  Physical Exam:  General: alert, cooperative and no distress Lochia: appropriate Uterine Fundus: firm Incision: n/a DVT Evaluation: No evidence of DVT seen on physical exam. Calf/Ankle edema is present.  Recent Labs    02/13/19 0455 02/14/19 0604  HGB 11.9* 10.8*  HCT 37.3 32.9*    Assessment/Plan: Discharge home and Breastfeeding  Discharge instructions reviewed   LOS: 1 day   [place] 02/14/2019, 1:55 PM

## 2019-02-14 NOTE — Progress Notes (Signed)
MOB was referred for history of depression/anxiety.  * Referral screened out by Clinical Social Worker because none of the following criteria appear to apply:  ~ History of anxiety/depression during this pregnancy, or of post-partum depression following prior delivery. ~ Diagnosis of anxiety and/or depression within last 3 years OR * MOB's symptoms currently being treated with medication and/or therapy. MOB currently taking Zoloft and Buspar and is followed by psychiatrist. No concerns noted in [facility] records.  Please contact the Clinical Social Worker if needs arise or by MOB request.  Elijio Miles, LCSW [facility] [place] [phone]

## 2021-08-10 NOTE — Telephone Encounter (Signed)
Close encounter 

## 2022-06-29 ENCOUNTER — Ambulatory Visit: Payer: Medicaid Other | Admitting: Podiatry

## 2022-06-29 ENCOUNTER — Ambulatory Visit (INDEPENDENT_AMBULATORY_CARE_PROVIDER_SITE_OTHER): Payer: Medicaid Other

## 2022-06-29 DIAGNOSIS — M79671 Pain in right foot: Secondary | ICD-10-CM

## 2022-06-29 DIAGNOSIS — M722 Plantar fascial fibromatosis: Secondary | ICD-10-CM | POA: Diagnosis not present

## 2022-06-29 MED ORDER — BETAMETHASONE SOD PHOS & ACET 6 (3-3) MG/ML IJ SUSP
3.0000 mg | Freq: Once | INTRAMUSCULAR | Status: AC
Start: 2022-06-29 — End: 2022-06-29
  Administered 2022-06-29: 3 mg via INTRA_ARTICULAR

## 2022-06-29 MED ORDER — MELOXICAM 15 MG PO TABS
15.0000 mg | ORAL_TABLET | Freq: Every day | ORAL | 1 refills | Status: DC
Start: 2022-06-29 — End: 2022-09-01

## 2022-06-29 MED ORDER — METHYLPREDNISOLONE 4 MG PO TBPK: ORAL_TABLET | ORAL | 0 refills | Status: DC

## 2022-06-29 NOTE — Progress Notes (Signed)
   Chief Complaint  Patient presents with   Plantar Fasciitis    right foot heel pain    Subjective: 37 y.o. female    Past Medical History:  Diagnosis Date   Anxiety    Depression    Vaginal Pap smear, abnormal      Objective: Physical Exam General: The patient is alert and oriented x3 in no acute distress.  Dermatology: Skin is warm, dry and supple bilateral lower extremities. Negative for open lesions or macerations bilateral.   Vascular: Dorsalis Pedis and Posterior Tibial pulses palpable bilateral.  Capillary fill time is immediate to all digits.  Neurological: Epicritic and protective threshold intact bilateral.   Musculoskeletal: Tenderness to palpation to the plantar aspect of the right heel along the plantar fascia. All other joints range of motion within normal limits bilateral. Strength 5/5 in all groups bilateral.   Radiographic exam RT foot 06/29/2022: Normal osseous mineralization. Joint spaces preserved. No fracture/dislocation/boney destruction. No other soft tissue abnormalities or radiopaque foreign bodies.   Assessment: 1. Plantar fasciitis right  Plan of Care:  1. Patient evaluated. Xrays reviewed.   2. Injection of 0.5cc Celestone soluspan injected into the right plantar fascia  3. Rx for Medrol Dose Pack placed 4. Rx for Meloxicam ordered for patient. 5. Plantar fascial band(s) dispensed 6. Instructed patient regarding therapies and modalities at home to alleviate symptoms.  7. Return to clinic in 4 weeks.     Felecia Shelling, DPM [facility]  Dr. Felecia Shelling, DPM    [address] [place], [place] [address]                Office [phone]  Fax [phone]

## 2022-07-05 ENCOUNTER — Ambulatory Visit: Payer: Medicaid Other | Admitting: Internal Medicine

## 2022-07-06 ENCOUNTER — Ambulatory Visit (INDEPENDENT_AMBULATORY_CARE_PROVIDER_SITE_OTHER): Payer: Medicaid Other

## 2022-07-06 ENCOUNTER — Encounter: Payer: Self-pay | Admitting: Internal Medicine

## 2022-07-06 ENCOUNTER — Ambulatory Visit: Payer: Medicaid Other | Attending: Internal Medicine | Admitting: Internal Medicine

## 2022-07-06 VITALS — BP 120/74 | HR 78 | Ht 66.0 in | Wt 278.0 lb

## 2022-07-06 DIAGNOSIS — R42 Dizziness and giddiness: Secondary | ICD-10-CM

## 2022-07-06 NOTE — Progress Notes (Signed)
Cardiology Office Note:    Date:  07/06/2022   ID:  Michele Wells, DOB 08-Jun-1985, MRN [id number]  PCP:  Patient, No Pcp Per   [facility] Providers Cardiologist:  None     Referring MD: Quita Skye, PA-C   No chief complaint on file. Light headedness  History of Present Illness:    Michele Wells is a 37 y.o. female with no sig pmhx. She saw Dr. Lequita Halt at [facility] family medicine and was having dizziness. Per prior HPI: "Pt has been having dizziness. Feel it has been going on for about 9 months. Happens randomly. At [facility] the other day was pushing cart (standing) and black came into vision. Sometimes comes out of nowhere and "hits her like a truck". Happens with position change. Feels like she is going to pass out. Has had vertigo before and this is different. Episodes can last long periods of time (yesterday was all day). Besides position change denies other triggers. Happens also not with position changes. Laying down seems to be the only thing that alleviates it. Has also tried sitting down and not moving, closing eyes. Psychiatry does not think it is mental health related. " She notes mostly when bending over or going from sitting to standing too quickly.  TSH is normal. No coffee No prior cardiac dx. In the past she had anxiety and she was having CP. She had a stress test and echo. This was all normal.   Past Medical History:  Diagnosis Date   Anxiety    Depression    Vaginal Pap smear, abnormal     Past Surgical History:  Procedure Laterality Date   APPENDECTOMY     COLPOSCOPY     WISDOM TOOTH EXTRACTION      Current Medications: Current Outpatient Medications on File Prior to Visit  Medication Sig Dispense Refill   ascorbic acid (VITAMIN C) 100 MG tablet Take 100 mg by mouth daily.     busPIRone (BUSPAR) 15 MG tablet Take 15 mg by mouth 3 (three) times daily.     cetirizine (ZYRTEC ALLERGY) 10 MG tablet      cholecalciferol (VITAMIN D3) 25 MCG  (1000 UT) tablet Take 5,000 Units by mouth once a week.     hydrocortisone 2.5 % cream Apply twice a day on affected area for 5 days and stop it     ketoconazole (NIZORAL) 2 % cream Apply once a day on affected areas     ketoconazole (NIZORAL) 2 % shampoo Use 1-2 times per week, let it lather 5-10 minutes and rinse well     lamotrigine (LAMICTAL) 25 MG disintegrating tablet Take 25 mg by mouth 2 (two) times daily.     meloxicam (MOBIC) 15 MG tablet Take 1 tablet (15 mg total) by mouth daily. 30 tablet 1   traZODone (DESYREL) 50 MG tablet Take 25 mg by mouth at bedtime.     propranolol (INDERAL) 10 MG tablet Take by mouth.     No current facility-administered medications on file prior to visit.     Allergies:   Patient has no known allergies.   Social History   Socioeconomic History   Marital status: Significant Other    Spouse name: Not on file   Number of children: Not on file   Years of education: Not on file   Highest education level: Not on file  Occupational History   Not on file  Tobacco Use   Smoking status: Former    Packs/day:  1.50    Years: 8.00    Additional pack years: 0.00    Total pack years: 12.00    Types: Cigarettes    Quit date: 03/16/2007    Years since quitting: 15.3   Smokeless tobacco: Never  Vaping Use   Vaping Use: Never used  Substance and Sexual Activity   Alcohol use: No   Drug use: No   Sexual activity: Yes    Birth control/protection: None  Other Topics Concern   Not on file  Social History Narrative   Not on file   Social Determinants of Health   Financial Resource Strain: Not on file  Food Insecurity: Not on file  Transportation Needs: Not on file  Physical Activity: Not on file  Stress: Not on file  Social Connections: Not on file     Family History: The patient's family history includes Breast cancer in her maternal grandmother; Diabetes in her maternal grandmother; Heart disease in her maternal grandfather and paternal  grandfather; Skin cancer in her mother; Thyroid cancer in her paternal grandmother. Grandfather had CABG Father had PCI Uncle and cousin had MI 52s, cousin was in his 41s (deceased)  ROS:   Please see the history of present illness.     All other systems reviewed and are negative.  EKGs/Labs/Other Studies Reviewed:    The following studies were reviewed today:   EKG:  EKG is  ordered today.  The ekg ordered today demonstrates   07/06/2022- NSR  Recent Labs: No results found for requested labs within last 365 days.   Recent Lipid Panel No results found for: "CHOL", "TRIG", "HDL", "CHOLHDL", "VLDL", "LDLCALC", "LDLDIRECT"   Risk Assessment/Calculations:         Physical Exam:    VS: Vitals:   07/06/22 1522 07/06/22 1536  BP: 113/73 120/74  Pulse: 73 78  SpO2: 96%      Wt Readings from Last 3 Encounters:  02/13/19 272 lb (123.4 kg)  01/26/17 266 lb (120.7 kg)  05/20/16 236 lb 3.2 oz (107.1 kg)     GEN:  Well nourished, well developed in no acute distress HEENT: Normal NECK: No JVD CARDIAC: RRR, no murmurs, rubs, gallops RESPIRATORY:  Clear to auscultation without rales, wheezing or rhonchi  ABDOMEN: Soft, non-tender, non-distended MUSCULOSKELETAL:  No edema; No deformity  SKIN: Warm and dry NEUROLOGIC:  Alert and oriented x 3 PSYCHIATRIC:  Normal affect   ASSESSMENT:    Light headedness: will get a cardiac monitor to ensure not rhythm related. So far no red flags. No signs of valve disease. Had normal prior echo in the past, no structural heart dx. PLAN:    In order of problems listed above:  7 day ziopatch Follow up pending results            Medication Adjustments/Labs and Tests Ordered: Current medicines are reviewed at length with the patient today.  Concerns regarding medicines are outlined above.  No orders of the defined types were placed in this encounter.  No orders of the defined types were placed in this encounter.   There are no  Patient Instructions on file for this visit.   Signed, Maisie Fus, MD  07/06/2022 3:08 PM    [facility]

## 2022-07-06 NOTE — Progress Notes (Unsigned)
Enrolled for [employer] to mail a ZIO XT long term holter monitor to the patients address on file.  

## 2022-07-06 NOTE — Patient Instructions (Signed)
Medication Instructions:   Your physician recommends that you continue on your current medications as directed. Please refer to the Current Medication list given to you today.  *If you need a refill on your cardiac medications before your next appointment, please call your pharmacy*  Lab Work:  NONE ordered at this time of appointment   If you have labs (blood work) drawn today and your tests are completely normal, you will receive your results only by: MyChart Message (if you have MyChart) OR A paper copy in the mail If you have any lab test that is abnormal or we need to change your treatment, we will call you to review the results.  Testing/Procedures:  ZIO XT- Long Term Monitor Instructions  Dr. Wyline Mood has requested you wear a ZIO patch monitor for 7 days.  This is a single patch monitor. [employer] supplies one patch monitor per enrollment. Additional stickers are not available. Please do not apply patch if you will be having a Nuclear Stress Test,  Echocardiogram, Cardiac CT, MRI, or Chest Xray during the period you would be wearing the  monitor. The patch cannot be worn during these tests. You cannot remove and re-apply the  ZIO XT patch monitor.  Your ZIO patch monitor will be mailed 3 day USPS to your address on file. It may take 3-5 days  to receive your monitor after you have been enrolled.  Once you have received your monitor, please review the enclosed instructions. Your monitor  has already been registered assigning a specific monitor serial # to you.  Billing and Patient Assistance Program Information  We have supplied [employer] with any of your insurance information on file for billing purposes. [employer] offers a sliding scale Patient Assistance Program for patients that do not have  insurance, or whose insurance does not completely cover the cost of the ZIO monitor.  You must apply for the Patient Assistance Program to qualify for this discounted rate.  To apply, please  call [employer] at [phone], select option 4, select option 2, ask to apply for  Patient Assistance Program. Meredeth Ide will ask your household income, and how many people  are in your household. They will quote your out-of-pocket cost based on that information.  [employer] will also be able to set up a 68-month, interest-free payment plan if needed.  Applying the monitor   Shave hair from upper left chest.  Hold abrader disc by orange tab. Rub abrader in 40 strokes over the upper left chest as  indicated in your monitor instructions.  Clean area with 4 enclosed alcohol pads. Let dry.  Apply patch as indicated in monitor instructions. Patch will be placed under collarbone on left  side of chest with arrow pointing upward.  Rub patch adhesive wings for 2 minutes. Remove white label marked "1". Remove the white  label marked "2". Rub patch adhesive wings for 2 additional minutes.  While looking in a mirror, press and release button in center of patch. A small green light will  flash 3-4 times. This will be your only indicator that the monitor has been turned on.  Do not shower for the first 24 hours. You may shower after the first 24 hours.  Press the button if you feel a symptom. You will hear a small click. Record Date, Time and  Symptom in the Patient Logbook.  When you are ready to remove the patch, follow instructions on the last 2 pages of Patient  Logbook. Stick patch monitor onto the last  page of Patient Logbook.  Place Patient Logbook in the blue and white box. Use locking tab on box and tape box closed  securely. The blue and white box has prepaid postage on it. Please place it in the mailbox as  soon as possible. Your physician should have your test results approximately 7 days after the  monitor has been mailed back to [facility].  Call [facility] Customer Care at [phone] if you have questions regarding  your ZIO XT patch monitor. Call them immediately if you see an  orange light blinking on your  monitor.  If your monitor falls off in less than 4 days, contact our Monitor department at [phone].  If your monitor becomes loose or falls off after 4 days call [employer] at [phone] for  suggestions on securing your monitor    Follow-Up: At [facility], you and your health needs are our priority.  As part of our continuing mission to provide you with exceptional heart care, we have created designated Provider Care Teams.  These Care Teams include your primary Cardiologist (physician) and Advanced Practice Providers (APPs -  Physician Assistants and Nurse Practitioners) who all work together to provide you with the care you need, when you need it.  Your next appointment:   As needed if symptoms worsen, fail to improve and/or new symptoms develop   Provider:   Maisie Fus, MD     Other Instructions

## 2022-07-09 DIAGNOSIS — R42 Dizziness and giddiness: Secondary | ICD-10-CM

## 2022-07-28 ENCOUNTER — Ambulatory Visit: Payer: Medicaid Other | Admitting: Podiatry

## 2022-07-28 DIAGNOSIS — M722 Plantar fascial fibromatosis: Secondary | ICD-10-CM

## 2022-07-28 MED ORDER — TRIAMCINOLONE ACETONIDE 40 MG/ML IJ SUSP
20.0000 mg | Freq: Once | INTRAMUSCULAR | Status: AC
Start: 2022-07-28 — End: 2022-07-28
  Administered 2022-07-28: 20 mg

## 2022-07-28 NOTE — Progress Notes (Signed)
   Chief Complaint  Patient presents with   Plantar Fasciitis    Right foot heel pain, follow-up, rate of pain 5 out of 10, pain came back 5/17, TX mobic, injection( seem to help)     Subjective: 37 y.o. female presenting today for follow-up evaluation of right heel pain.  Patient states that overall she is significantly better.  For a few weeks after her visit on 06/29/2022 she had no pain or tenderness associated to the heel or foot.  Over the past few weeks however she has had some recurrence of the pain and tenderness associated to the heel.  Presenting for further treatment evaluation   Past Medical History:  Diagnosis Date   Anxiety    Depression    Vaginal Pap smear, abnormal    Past Surgical History:  Procedure Laterality Date   APPENDECTOMY     COLPOSCOPY     WISDOM TOOTH EXTRACTION     No Known Allergies   Objective: Physical Exam General: The patient is alert and oriented x3 in no acute distress.  Dermatology: Skin is warm, dry and supple bilateral lower extremities. Negative for open lesions or macerations bilateral.   Vascular: Dorsalis Pedis and Posterior Tibial pulses palpable bilateral.  Capillary fill time is immediate to all digits.  Neurological: Epicritic and protective threshold intact bilateral.   Musculoskeletal: There continues to be some tenderness to palpation to the plantar aspect of the right heel along the plantar fascia. All other joints range of motion within normal limits bilateral. Strength 5/5 in all groups bilateral.   Radiographic exam RT foot 06/29/2022: Normal osseous mineralization. Joint spaces preserved. No fracture/dislocation/boney destruction. No other soft tissue abnormalities or radiopaque foreign bodies.   Assessment: 1. Plantar fasciitis right  -Patient evaluated -Injection of 0.5 cc Celestone Soluspan injected around the plantar fascia right -Continue meloxicam 15 mg daily -Continue plantar fascia brace -Continue to  refrain from going barefoot around the house.  Patient states that she purchased a pair of new close sandals -Return to clinic 6 weeks   Felecia Shelling, DPM [facility]  Dr. Felecia Shelling, DPM    [address] [place], [place] [address]                Office [phone]  Fax [phone]

## 2022-08-04 ENCOUNTER — Encounter: Payer: Self-pay | Admitting: Neurology

## 2022-09-01 ENCOUNTER — Ambulatory Visit: Payer: Medicaid Other | Admitting: Podiatry

## 2022-09-01 DIAGNOSIS — M722 Plantar fascial fibromatosis: Secondary | ICD-10-CM | POA: Diagnosis not present

## 2022-09-01 MED ORDER — MELOXICAM 15 MG PO TABS: 15.0000 mg | ORAL_TABLET | Freq: Every day | ORAL | 1 refills | Status: AC

## 2022-09-01 NOTE — Progress Notes (Signed)
   No chief complaint on file.   Subjective: 37 y.o. female presenting today for follow-up evaluation of right heel pain.  Overall patient is doing well.  She has noticed improvement.  Presenting for further treatment and evaluation   Past Medical History:  Diagnosis Date   Anxiety    Depression    Vaginal Pap smear, abnormal    Past Surgical History:  Procedure Laterality Date   APPENDECTOMY     COLPOSCOPY     WISDOM TOOTH EXTRACTION     No Known Allergies   Objective: Physical Exam General: The patient is alert and oriented x3 in no acute distress.  Dermatology: Skin is warm, dry and supple bilateral lower extremities. Negative for open lesions or macerations bilateral.   Vascular: Dorsalis Pedis and Posterior Tibial pulses palpable bilateral.  Capillary fill time is immediate to all digits.  Neurological: Epicritic and protective threshold intact bilateral.   Musculoskeletal: Minimal tenderness to palpation to the plantar aspect of the right heel along the plantar fascia. All other joints range of motion within normal limits bilateral. Strength 5/5 in all groups bilateral.   Radiographic exam RT foot 06/29/2022: Normal osseous mineralization. Joint spaces preserved. No fracture/dislocation/boney destruction. No other soft tissue abnormalities or radiopaque foreign bodies.   Assessment: 1. Plantar fasciitis right  -Patient evaluated - Continue meloxicam 15 mg daily -Advised against going barefoot.  Recommend good supportive tennis shoes and sneakers -OTC prefabricated PowerStep insoles were dispensed at checkout -Return to clinic as needed   Felecia Shelling, DPM [facility]  Dr. Felecia Shelling, DPM    [address] [place], [place] [address]                Office [phone]  Fax [phone]

## 2022-09-03 ENCOUNTER — Ambulatory Visit: Payer: Medicaid Other | Admitting: Neurology

## 2022-09-03 ENCOUNTER — Encounter: Payer: Self-pay | Admitting: Neurology

## 2022-09-03 VITALS — BP 130/83 | HR 70 | Ht 66.0 in | Wt 272.6 lb

## 2022-09-03 DIAGNOSIS — R55 Syncope and collapse: Secondary | ICD-10-CM | POA: Diagnosis not present

## 2022-09-03 NOTE — Patient Instructions (Signed)
Good to meet you.  Schedule MRI brain with and without contrast  2. Schedule EEG  3. Continue with increasing hydration and liberalizing salt intake, start doing counterpressure maneuvers  4. Follow-up in 3-4 months, call for any changes

## 2022-09-03 NOTE — Progress Notes (Signed)
NEUROLOGY CONSULTATION NOTE  [name] MRN: [id number] DOB: 1985/04/20  Referring provider: Quita Skye, PA-C Primary care provider: Quita Skye, PA-C  Reason for consult:  presyncope  Thank you for your kind referral of Michele Wells for consultation of the above symptoms. Although her history is well known to you, please allow me to reiterate it for the purpose of our medical record. She is alone in the office today. Records and images were personally reviewed where available.   HISTORY OF PRESENT ILLNESS: This is a 37 year old right-handed woman with a history of depression, anxiety, presenting for evaluation of presyncope. She reports that symptoms started a year ago, initially lightheadedness was light and barely noticeable. Symptoms started working, she feels like she is swaying and losing her balance. Symptoms can occur when she is sitting, standing, walking, occurring on a daily basis . She usually tries to sit or walk slower. She now has to use a grabber to pickup toys because symptoms are worse when she bends down and comes up. They last 2-3 minutes with no associated nausea/vomiting, focal numbness/tingling/weakness. Vision and hearing are not affected. She occasionally gets headaches but they are separate from the lightheadedness. She denies any neck pain. She reports only one incident of loss of consciousness as a teenager when she felt lightheaded, wobbly, then blacked out. She denies any staring/unresponsive episodes, olfactory/gustatory hallucinations, myoclonic jerks. Memory is "wonky," she forgets what she has to do. It has not affected daily activities such as driving or medications. She has a history of migraines with pain behind her eyes and light sensitivity. No nausea/vomiting. Over the counter pain medications do not help, she states she just deals with constant pain. She has a history of vertigo and notes the lightheadedness is different. She has seen ENT  and Cardiology. She had a 7-day cardiac monitor in 06/2022 with report of 32 triggered events for sinus rhythm, sinus tachycardia. She has increased her water intake which took a slight edge but without great improvement. She gets 5-6 hours of sleep. She lives with her husband and 2 children ages 40 and 65. She had a normal birth and early development.  There is no history of febrile convulsions, CNS infections such as meningitis/encephalitis, significant traumatic brain injury, neurosurgical procedures, or family history of seizures.   PAST MEDICAL HISTORY: Past Medical History:  Diagnosis Date   Anxiety    Depression    Vaginal Pap smear, abnormal     PAST SURGICAL HISTORY: Past Surgical History:  Procedure Laterality Date   APPENDECTOMY     COLPOSCOPY     WISDOM TOOTH EXTRACTION      MEDICATIONS: Current Outpatient Medications on File Prior to Visit  Medication Sig Dispense Refill   ascorbic acid (VITAMIN C) 100 MG tablet Take 100 mg by mouth daily.     buPROPion (WELLBUTRIN XL) 300 MG 24 hr tablet Take 300 mg by mouth daily.     busPIRone (BUSPAR) 15 MG tablet Take 15 mg by mouth 3 (three) times daily.     cetirizine (ZYRTEC ALLERGY) 10 MG tablet      cholecalciferol (VITAMIN D3) 25 MCG (1000 UT) tablet Take 5,000 Units by mouth once a week.     hydrocortisone 2.5 % cream Apply twice a day on affected area for 5 days and stop it     ketoconazole (NIZORAL) 2 % cream Apply once a day on affected areas     ketoconazole (NIZORAL) 2 % shampoo Use 1-2  times per week, let it lather 5-10 minutes and rinse well     lamoTRIgine (LAMICTAL) 100 MG tablet Take 50 mg by mouth 2 (two) times daily.     meloxicam (MOBIC) 15 MG tablet Take 1 tablet (15 mg total) by mouth daily. 60 tablet 1   traZODone (DESYREL) 50 MG tablet Take 25 mg by mouth at bedtime.     No current facility-administered medications on file prior to visit.    ALLERGIES: No Known Allergies  FAMILY HISTORY: Family History   Problem Relation Age of Onset   Diabetes Maternal Grandmother    Breast cancer Maternal Grandmother    Heart disease Maternal Grandfather    Heart disease Paternal Grandfather    Skin cancer Mother    Thyroid cancer Paternal Grandmother     SOCIAL HISTORY: Social History   Socioeconomic History   Marital status: Significant Other    Spouse name: Not on file   Number of children: Not on file   Years of education: Not on file   Highest education level: Not on file  Occupational History   Not on file  Tobacco Use   Smoking status: Former    Packs/day: 1.50    Years: 8.00    Additional pack years: 0.00    Total pack years: 12.00    Types: Cigarettes    Quit date: 03/16/2007    Years since quitting: 15.4   Smokeless tobacco: Never  Vaping Use   Vaping Use: Never used  Substance and Sexual Activity   Alcohol use: No   Drug use: No   Sexual activity: Yes    Birth control/protection: None  Other Topics Concern   Not on file  Social History Narrative   Are you right handed or left handed? right   Are you currently employed ? 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 no   Who lives with you? Family kids husband    What type of home do you live in: 1 story or 2 story?  1 story        Social Determinants of Corporate investment banker Strain: Not on file  Food Insecurity: Not on file  Transportation Needs: Not on file  Physical Activity: Not on file  Stress: Not on file  Social Connections: Not on file  Intimate Partner Violence: Not on file     PHYSICAL EXAM: Orthostatic vital signs: Supine BP 122/70     HR 78 Sitting BP 110/62      HR 71 Standing BP 108/60  HR 69 Vitals:   09/03/22 1050 09/03/22 1051  BP:    Pulse:    SpO2: 96% 98%   General: No acute distress Head:  Normocephalic/atraumatic Skin/Extremities: No rash, no edema Neurological Exam: Mental status: alert and oriented to person, place, and time, no  dysarthria or aphasia, Fund of knowledge is appropriate.  Recent and remote memory are intact, 3/3 delayed recall.  Attention and concentration are normal, 5/5 WORLD backwards. Cranial nerves: CN I: not tested CN II: pupils equal, round, visual fields intact CN III, IV, VI:  full range of motion, no nystagmus, no ptosis CN V: facial sensation intact CN VII: upper and lower face symmetric CN VIII: hearing intact to conversation Bulk & Tone: normal, no fasciculations. Motor: 5/5 throughout with no pronator drift. Sensation: intact to light touch, cold, pin, vibration sense.  No extinction to double simultaneous stimulation.  Romberg test negative Deep Tendon Reflexes: brisk +2 throughout Cerebellar: no incoordination on finger to nose testing Gait: narrow-based and steady, able to tandem walk adequately. Tremor: none   IMPRESSION: This is a 37 year old right-handed woman with a history of depression, anxiety, presenting for evaluation of presyncope. She reports positional lightheadedness. She is noted to have orthostatic hypotension in the office today, 122/70 supine to 108/60 standing. Her neurological exam is normal. We discussed doing a brain MRI with and without contrast and EEG to further evaluate symptoms however symptoms most likely due to orthostasis. She was advised to continue increasing hydration, liberalize salt intake, try counterpressure maneuvers, compression stockings. Follow-up in 3-4 months, call for any changes.    Thank you for allowing me to participate in the care of this patient. Please do not hesitate to call for any questions or concerns.   Patrcia Dolly, M.D.  CC: Quita Skye, PA-C

## 2022-09-04 ENCOUNTER — Other Ambulatory Visit: Payer: Medicaid Other

## 2022-09-05 ENCOUNTER — Ambulatory Visit
Admission: RE | Admit: 2022-09-05 | Discharge: 2022-09-05 | Disposition: A | Discharge status: Home / Self Care | Payer: Medicaid Other | Source: Ambulatory Visit | Attending: Neurology | Admitting: Neurology

## 2022-09-05 DIAGNOSIS — R55 Syncope and collapse: Secondary | ICD-10-CM

## 2022-09-05 MED ORDER — GADOPICLENOL 0.5 MMOL/ML IV SOLN
10.0000 mL | Freq: Once | INTRAVENOUS | Status: AC | PRN
  Administered 2022-09-05: 10 mL via INTRAVENOUS

## 2022-09-06 ENCOUNTER — Ambulatory Visit (INDEPENDENT_AMBULATORY_CARE_PROVIDER_SITE_OTHER): Payer: Medicaid Other | Admitting: Neurology

## 2022-09-06 DIAGNOSIS — R55 Syncope and collapse: Secondary | ICD-10-CM

## 2022-09-14 NOTE — Procedures (Signed)
ELECTROENCEPHALOGRAM REPORT  Date of Study: 09/06/2022  Patient's Name: Michele Wells MRN: [id number] Date of Birth: Mar 13, 1985  Referring Provider: Dr. Patrcia Dolly  Clinical History: This is a 37 year old woman with recurrent episodes of lightheadedness. EEG for classification.  Medications: Lamictal, Wellbutrin, Buspar, Trazodone  Technical Summary: A multichannel digital EEG recording measured by the international 10-20 system with electrodes applied with paste and impedances below 5000 ohms performed in our laboratory with EKG monitoring in an awake and asleep patient.  Hyperventilation and photic stimulation were performed.  The digital EEG was referentially recorded, reformatted, and digitally filtered in a variety of bipolar and referential montages for optimal display.    Description: The patient is awake and asleep during the recording.  During maximal wakefulness, there is a symmetric, medium voltage 10 Hz posterior dominant rhythm that attenuates with eye opening.  The record is symmetric.  During drowsiness and sleep, there is an increase in theta slowing of the background.  Vertex waves and symmetric sleep spindles were seen. Hyperventilation and photic stimulation did not elicit any abnormalities.  There were no epileptiform discharges or electrographic seizures seen.    EKG lead was unremarkable.  Impression: This awake and asleep EEG is normal.    Clinical Correlation: A normal EEG does not exclude a clinical diagnosis of epilepsy.  If further clinical questions remain, prolonged EEG may be helpful.  Clinical correlation is advised.   Patrcia Dolly, M.D.

## 2022-09-20 ENCOUNTER — Telehealth: Payer: Self-pay | Admitting: Neurology

## 2022-09-20 NOTE — Telephone Encounter (Signed)
Patient called back from missed call. Evlyn Clines

## 2022-09-22 NOTE — Telephone Encounter (Signed)
I advised and thanked me for calling

## 2022-09-22 NOTE — Telephone Encounter (Signed)
Patient states that she is calling for EEG results/KB

## 2022-09-22 NOTE — Telephone Encounter (Signed)
Left message to call office back, unsure of the request.

## 2022-12-08 ENCOUNTER — Ambulatory Visit: Payer: Medicaid Other | Admitting: Neurology

## 2022-12-08 ENCOUNTER — Encounter: Payer: Self-pay | Admitting: Neurology

## 2024-02-27 ENCOUNTER — Ambulatory Visit: Attending: Obstetrics and Gynecology

## 2024-02-27 NOTE — Progress Notes (Incomplete)
 " OUTPATIENT PHYSICAL THERAPY FEMALE PELVIC EVALUATION   Patient Name: Michele Wells MRN: [id number] DOB:01/11/1986, 38 y.o., female Today's Date: 02/27/2024  END OF SESSION:   Past Medical History:  Diagnosis Date   Anxiety    Depression    Vaginal Pap smear, abnormal    Past Surgical History:  Procedure Laterality Date   APPENDECTOMY     COLPOSCOPY     WISDOM TOOTH EXTRACTION     Patient Active Problem List   Diagnosis Date Noted   [redacted] weeks gestation of pregnancy 02/13/2019   NSVD (normal spontaneous vaginal delivery) 02/13/2019    PCP: Buck Search, PA-C  REFERRING PROVIDER: Kristie Staggers, FNP   REFERRING DIAG: [phone] (ICD-10-CM) - Other specified disorders of muscle  THERAPY DIAG:  No diagnosis found.  Rationale for Evaluation and Treatment: Rehabilitation  ONSET DATE: ***  SUBJECTIVE:                                                                                                                                                                                           SUBJECTIVE STATEMENT: ***   PAIN:  Are you having pain? {yes/no:20286} NPRS scale: ***/10 Pain location: {pelvic pain location:27098}  Pain type: {type:313116} Pain description: {PAIN DESCRIPTION:21022940}   Aggravating factors: *** Relieving factors: ***  PRECAUTIONS: None  RED FLAGS: None   WEIGHT BEARING RESTRICTIONS: No  FALLS:  Has patient fallen in last 6 months? No  OCCUPATION: ***  ACTIVITY LEVEL : ***  PLOF: Independent  PATIENT GOALS: ***  PERTINENT HISTORY:  *** Sexual abuse: {Yes/No:304960894}  BOWEL MOVEMENT: Pain with bowel movement: {yes/no:20286} Type of bowel movement:{PT BM type:27100} Fully empty rectum: {No/Yes:304960894} Leakage: {Yes/No:304960894} Urgency: {Yes/No:304960894} Pads: {Yes/No:304960894} Fiber supplement/laxative {YES/NO AS:20300}  URINATION: Pain with urination: {yes/no:20286} Fully empty bladder:  {Yes/No:304960894} Stream: {PT urination:27102} Urgency: {YES/NO AS:20300} Frequency: *** Nocturia: *** Fluid Intake: *** Leakage: {PT leakage:27103} Pads: {Yes/No:304960894}  INTERCOURSE:  Ability to have vaginal penetration {YES/NO:21197} Pain with intercourse: {pain with intercourse PA:27099} Dryness{YES/NO AS:20300} Climax: *** Marinoff Scale: ***/3 Lubricant: ***  PREGNANCY: Vaginal deliveries *** Tearing {Yes***/No:304960894} Episiotomy {YES/NO AS:20300} C-section deliveries *** Currently pregnant {Yes***/No:304960894}  PROLAPSE: {PT prolapse:27101}   OBJECTIVE:  Note: Objective measures were completed at Evaluation unless otherwise noted.  02/27/24 PATIENT SURVEYS:   PFIQ-7: ***  COGNITION: Overall cognitive status: Within functional limits for tasks assessed     SENSATION: Light touch: Appears intact   FUNCTIONAL TESTS:  Squat: *** Single leg stance:  Rt: ***  Lt: *** Curl-up test: *** Sit-up test: *** Active straight leg raise: ***   GAIT: Assistive device utilized: None Comments: WNL  POSTURE: {posture:25561}  LUMBARAROM/PROM:  A/PROM A/PROM  Eval (% available)  Flexion   Extension   Right lateral flexion   Left lateral flexion   Right rotation   Left rotation    (Blank rows = not tested)  PALPATION: General: ***  Abdominal: Sternocostal angle: *** Breathing: *** Tenderness: *** Scar tissue: *** Diastasis: ***                External Perineal Exam: ***                             Internal Pelvic Floor: ***  Patient confirms identification and approves PT to assess internal pelvic floor and treatment {yes/no:20286}  PELVIC MMT:   MMT eval  Vaginal   Internal Anal Sphincter   External Anal Sphincter   Puborectalis   (Blank rows = not tested)        TONE: ***  PROLAPSE: ***  TODAY'S TREATMENT:                                                                                                                               DATE:  *** EVAL  Manual:  Neuromuscular re-education:  Exercises:  Therapeutic activities:     PATIENT EDUCATION:  Education details: See above Person educated: Patient Education method: Explanation, Demonstration, Tactile cues, Verbal cues, and Handouts Education comprehension: verbalized understanding  HOME EXERCISE PROGRAM: ***  ASSESSMENT:  CLINICAL IMPRESSION: Patient is a 38 y.o. female who was seen today for physical therapy evaluation and treatment for ***.   OBJECTIVE IMPAIRMENTS: decreased activity tolerance, decreased coordination, decreased endurance, decreased mobility, decreased ROM, decreased strength, increased fascial restrictions, increased muscle spasms, impaired flexibility, impaired tone, improper body mechanics, postural dysfunction, and pain.   ACTIVITY LIMITATIONS: {activitylimitations:27494}  PARTICIPATION LIMITATIONS: {participationrestrictions:25113}  PERSONAL FACTORS: {Personal factors:25162} are also affecting patient's functional outcome.   REHAB POTENTIAL: {rehabpotential:25112}  CLINICAL DECISION MAKING: {clinical decision making:25114}  EVALUATION COMPLEXITY: {Evaluation complexity:25115}   GOALS: Goals reviewed with patient? {yes/no:20286}  SHORT TERM GOALS: Target date: ***  Pt will be independent with HEP in order to improve activity tolerance.   Baseline: Goal status: INITIAL  2.  ***  Baseline:  Goal status: INITIAL  3.  ***  Baseline:  Goal status: INITIAL  4.  *** Baseline:  Goal status: INITIAL  5.  *** Baseline:  Goal status: INITIAL  6.  *** Baseline:  Goal status: INITIAL  LONG TERM GOALS: Target date: ***  Pt will be independent with advanced HEP in order to improve activity tolerance.   Baseline:  Goal status: INITIAL  2.  *** Baseline:  Goal status: INITIAL  3.  *** Baseline:  Goal status: INITIAL  4.  *** Baseline:  Goal status: INITIAL  5.  *** Baseline:  Goal  status: INITIAL  6.  *** Baseline:  Goal status: INITIAL  PLAN:  PT FREQUENCY: 1-2x/week  PT DURATION: ***  PLANNED INTERVENTIONS: 97164- PT Re-evaluation, 97110-Therapeutic exercises, 97530- Therapeutic activity, 97112- Neuromuscular re-education, [phone]- Self Care, 02859- Manual therapy, [phone]- Gait training, [phone]- Aquatic Therapy, [phone]- Electrical stimulation (unattended), [phone]- Traction (mechanical), [phone]- Ionotophoresis 4mg /ml Dexamethasone, 20560 (1-2 muscles), 20561 (3+ muscles)- Dry Needling, Patient/Family education, Balance training, Taping, Joint mobilization, Joint manipulation, Spinal manipulation, Spinal mobilization, Scar mobilization, Vestibular training, Cryotherapy, Moist heat, and Biofeedback  PLAN FOR NEXT SESSION: ***  Josette Mares, PT, DPT12/29/20257:51 AM [facility] [address], [address] [place], [place] [address] Phone # [phone] Fax [phone]   "

## 2024-04-19 ENCOUNTER — Ambulatory Visit
